# Patient Record
Sex: Male | Born: 1980 | Race: White | Hispanic: No | Marital: Married | Smoking: Never smoker
Health system: Southern US, Community
[De-identification: ages and names within clinical notes are randomized; demographics above are authoritative.]

## PROBLEM LIST (undated history)

## (undated) DIAGNOSIS — R569 Unspecified convulsions: Secondary | ICD-10-CM

## (undated) HISTORY — DX: Unspecified convulsions: R56.9

---

## 2020-10-13 ENCOUNTER — Encounter: Payer: Self-pay | Admitting: *Deleted

## 2020-10-15 ENCOUNTER — Encounter: Payer: Self-pay | Admitting: Diagnostic Neuroimaging

## 2020-10-15 ENCOUNTER — Ambulatory Visit (INDEPENDENT_AMBULATORY_CARE_PROVIDER_SITE_OTHER): Payer: 59 | Admitting: Diagnostic Neuroimaging

## 2020-10-15 ENCOUNTER — Other Ambulatory Visit: Payer: Self-pay

## 2020-10-15 VITALS — BP 135/92 | HR 82 | Ht 69.0 in | Wt 265.0 lb

## 2020-10-15 DIAGNOSIS — R569 Unspecified convulsions: Secondary | ICD-10-CM | POA: Diagnosis not present

## 2020-10-15 NOTE — Patient Instructions (Signed)
-   MRI brain, EEG  - According to [place] law, you can not drive unless you are seizure / syncope free for at least 6 months and under physician's care.   - Please maintain precautions. Do not participate in activities where a loss of awareness could harm you or someone else. No swimming alone, no tub bathing, no hot tubs, no driving, no operating motorized vehicles (cars, ATVs, motocycles, etc), lawnmowers, power tools or firearms. No standing at heights, such as rooftops, ladders or stairs. Avoid hot objects such as stoves, heaters, open fires. Wear a helmet when riding a bicycle, scooter, skateboard, etc. and avoid areas of traffic. Set your water heater to 120 degrees or less.

## 2020-10-15 NOTE — Progress Notes (Signed)
[facility]  PATIENT: Francis Flores DOB: 04-24-1981  REFERRING CLINICIAN: Koirala, Dibas, MD HISTORY FROM: patient and wife  REASON FOR VISIT: new consult    HISTORICAL  CHIEF COMPLAINT:  Chief Complaint  Patient presents with  . New Patient (Initial Visit)    rm 6- Pt here with his wife because he has a seizure in his sleep.    HISTORY OF PRESENT ILLNESS:   39 year old male here for evaluation of seizure.  10/08/2020 at 2 in the morning patient was asleep, and woke up wife because he could grab her arm.  She woke up and then found that he was unresponsive, with generalized tonic-clonic seizure.  His body was shaking but arms were not moving.  Seizures lasted for about 1 to 2 minutes.  Patient bit his tongue.  Afterwards he went to a deep sleep and started to snore.  She called 911 and paramedics arrived.  Vital signs showed blood pressure 146/94, pulse 118, blood glucose 94, temperature 97.1, EKG strip showed sinus tachycardia.  Within about 1 hour he started to wake up and was able to answer questions.  Patient has had some headaches and muscle soreness since the seizure.  They followed up with walk-in urgent care clinic the next day and had lab testing which were unremarkable.  Patient referred here for further evaluation.  In the few days leading up to this event patient had been having some stomach pain issues and decreased sleep.  No prior or recent accidents injuries or traumas.  No history of meningitis or encephalitis.  No family history of seizure.  No similar events in his past.  Patient does not drink caffeine or alcohol.    REVIEW OF SYSTEMS: Full 14 system review of systems performed and negative with exception of: As per HPI.  ALLERGIES: No Known Allergies  HOME MEDICATIONS: No outpatient medications prior to visit.   No facility-administered medications prior to visit.    PAST MEDICAL HISTORY: Past Medical History:  Diagnosis Date   . Seizure-like activity ([place])     PAST SURGICAL HISTORY: History reviewed. No pertinent surgical history.  FAMILY HISTORY: No family history on file.  SOCIAL HISTORY: Social History   Socioeconomic History  . Marital status: Married    Spouse name: Not on file  . Number of children: 0  . Years of education: Not on file  . Highest education level: Not on file  Occupational History    Comment: engineer  Tobacco Use  . Smoking status: Never Smoker  Substance and Sexual Activity  . Alcohol use: Not Currently  . Drug use: Not Currently  . Sexual activity: Not on file  Other Topics Concern  . Not on file  Social History Narrative  . Not on file   Social Determinants of Health   Financial Resource Strain:   . Difficulty of Paying Living Expenses: Not on file  Food Insecurity:   . Worried About Charity fundraiser in the Last Year: Not on file  . Ran Out of Food in the Last Year: Not on file  Transportation Needs:   . Lack of Transportation (Medical): Not on file  . Lack of Transportation (Non-Medical): Not on file  Physical Activity:   . Days of Exercise per Week: Not on file  . Minutes of Exercise per Session: Not on file  Stress:   . Feeling of Stress : Not on file  Social Connections:   . Frequency of Communication with Friends and  Family: Not on file  . Frequency of Social Gatherings with Friends and Family: Not on file  . Attends Religious Services: Not on file  . Active Member of Clubs or Organizations: Not on file  . Attends Archivist Meetings: Not on file  . Marital Status: Not on file  Intimate Partner Violence:   . Fear of Current or Ex-Partner: Not on file  . Emotionally Abused: Not on file  . Physically Abused: Not on file  . Sexually Abused: Not on file     PHYSICAL EXAM  GENERAL EXAM/CONSTITUTIONAL: Vitals:  Vitals:   10/15/20 0928 10/15/20 0930  BP: (!) 136/96 (!) 135/92  Pulse: 80 82  Weight: 265 lb (120.2 kg)   Height: 5\' 9"   (1.753 m)      Body mass index is 39.13 kg/m. Wt Readings from Last 3 Encounters:  10/15/20 265 lb (120.2 kg)     Patient is in no distress; well developed, nourished and groomed; neck is supple  CARDIOVASCULAR:  Examination of carotid arteries is normal; no carotid bruits  Regular rate and rhythm, no murmurs  Examination of peripheral vascular system by observation and palpation is normal  EYES:  Ophthalmoscopic exam of optic discs and posterior segments is normal; no papilledema or hemorrhages  No exam data present  MUSCULOSKELETAL:  Gait, strength, tone, movements noted in Neurologic exam below  NEUROLOGIC: MENTAL STATUS:  No flowsheet data found.  awake, alert, oriented to person, place and time  recent and remote memory intact  normal attention and concentration  language fluent, comprehension intact, naming intact  fund of knowledge appropriate  CRANIAL NERVE:   2nd - no papilledema on fundoscopic exam  2nd, 3rd, 4th, 6th - pupils equal and reactive to light, visual fields full to confrontation, extraocular muscles intact, no nystagmus  5th - facial sensation symmetric  7th - facial strength symmetric  8th - hearing intact  9th - palate elevates symmetrically, uvula midline  11th - shoulder shrug symmetric  12th - tongue protrusion midline  MOTOR:   normal bulk and tone, full strength in the BUE, BLE  SENSORY:   normal and symmetric to light touch, temperature, vibration  COORDINATION:   finger-nose-finger, fine finger movements normal  REFLEXES:   deep tendon reflexes present and symmetric  GAIT/STATION:   narrow based gait; romberg is negative    DIAGNOSTIC DATA (LABS, IMAGING, TESTING) - I reviewed patient records, labs, notes, testing and imaging myself where available.  No results found for: WBC, HGB, HCT, MCV, PLT No results found for: NA, K, CL, CO2, GLUCOSE, BUN, CREATININE, CALCIUM, PROT, ALBUMIN, AST, ALT,  ALKPHOS, BILITOT, GFRNONAA, GFRAA No results found for: CHOL, HDL, LDLCALC, LDLDIRECT, TRIG, CHOLHDL No results found for: HGBA1C No results found for: VITAMINB12 No results found for: TSH   10/08/20 CBC, CMP normal except slightly elevated LFTs    ASSESSMENT AND PLAN  39 y.o. year old male here with:  Dx:  1. New onset seizure ([place])      PLAN:   NEW ONSET SEIZURE (10/08/20)  - check MRI brain, EEG; hold off on anti-seizure meds for now, unless MRI or EEG show abnl  - According to [place] law, you can not drive unless you are seizure / syncope free for at least 6 months and under physician's care.   - Please maintain precautions. Do not participate in activities where a loss of awareness could harm you or someone else. No swimming alone, no tub bathing, no hot tubs,  no driving, no operating motorized vehicles (cars, ATVs, motocycles, etc), lawnmowers, power tools or firearms. No standing at heights, such as rooftops, ladders or stairs. Avoid hot objects such as stoves, heaters, open fires. Wear a helmet when riding a bicycle, scooter, skateboard, etc. and avoid areas of traffic. Set your water heater to 120 degrees or less.   Orders Placed This Encounter  Procedures  . MR BRAIN W WO CONTRAST  . EEG adult   Return in about 6 months (around 04/15/2021).    Penni Bombard, MD 43/70/0525, 91:02 AM Certified in Neurology, Neurophysiology and Neuroimaging  [facility] [address], [place], [place] [address] [phone]

## 2020-10-18 ENCOUNTER — Telehealth: Payer: Self-pay | Admitting: Diagnostic Neuroimaging

## 2020-10-18 NOTE — Telephone Encounter (Signed)
[employer] order sent to [facility]. They will obtain the auth and reach out to the patient to schedule.  

## 2020-10-29 ENCOUNTER — Other Ambulatory Visit: Payer: Self-pay

## 2020-10-29 ENCOUNTER — Ambulatory Visit
Admission: RE | Admit: 2020-10-29 | Discharge: 2020-10-29 | Disposition: A | Discharge status: Home / Self Care | Payer: 59 | Source: Ambulatory Visit | Attending: Diagnostic Neuroimaging | Admitting: Diagnostic Neuroimaging

## 2020-10-29 DIAGNOSIS — R569 Unspecified convulsions: Secondary | ICD-10-CM | POA: Diagnosis not present

## 2020-10-29 MED ORDER — GADOBENATE DIMEGLUMINE 529 MG/ML IV SOLN
20.0000 mL | Freq: Once | INTRAVENOUS | Status: AC | PRN
  Administered 2020-10-29: 20 mL via INTRAVENOUS

## 2020-10-31 ENCOUNTER — Telehealth: Payer: Self-pay | Admitting: Neurology

## 2020-11-01 ENCOUNTER — Telehealth: Payer: Self-pay | Admitting: *Deleted

## 2020-11-01 MED ORDER — LEVETIRACETAM 500 MG PO TABS: 500.0000 mg | ORAL_TABLET | Freq: Two times a day (BID) | ORAL | 12 refills | Status: DC

## 2020-11-01 NOTE — Telephone Encounter (Signed)
Spoke with patient and informed him the MRI brain result was an abnormal study with asmall focus of bleeding.  Dr Leta Baptist recommends to start levetiracetam 500mg  twice a day. He will order additional testing. I advised he wants to have a phone / video visit. Patient agreed to new Rx; I advised he activate My Chart which is pending. He agreed, and we scheduled my chart VV for Wed. Patient verbalized understanding, appreciation.

## 2020-11-01 NOTE — Telephone Encounter (Signed)
I called patient to confirm.  Reviewed MRI results.  We'll start levetiracetam 500 mg twice a day.  We'll follow up on video visit on Wednesday to discuss further testing.  We'll plan to check repeat MRI in 4 to 6 weeks.  Also may check CT chest abdomen and pelvis to rule out other etiologies.  Meds ordered this encounter  Medications  . levETIRAcetam (KEPPRA) 500 MG tablet    Sig: Take 1 tablet (500 mg total) by mouth 2 (two) times daily.    Dispense:  60 tablet    Refill:  [place], MD 13/24/4010, 2:72 PM Certified in Neurology, Neurophysiology and Neuroimaging  [facility] [address], [place], [place] [address] [phone]

## 2020-11-03 ENCOUNTER — Encounter: Payer: Self-pay | Admitting: Diagnostic Neuroimaging

## 2020-11-03 ENCOUNTER — Telehealth (INDEPENDENT_AMBULATORY_CARE_PROVIDER_SITE_OTHER): Payer: 59 | Admitting: Diagnostic Neuroimaging

## 2020-11-03 DIAGNOSIS — I611 Nontraumatic intracerebral hemorrhage in hemisphere, cortical: Secondary | ICD-10-CM | POA: Diagnosis not present

## 2020-11-03 DIAGNOSIS — R569 Unspecified convulsions: Secondary | ICD-10-CM

## 2020-11-03 NOTE — Progress Notes (Signed)
[facility]  PATIENT: Francis Flores DOB: 08-17-1981  REFERRING CLINICIAN: Koirala, Dibas, MD HISTORY FROM: patient  REASON FOR VISIT: follow up   HISTORICAL  CHIEF COMPLAINT:  Chief Complaint  Patient presents with  . Seizures    HISTORY OF PRESENT ILLNESS:   UPDATE (11/03/20, VRP): Since last visit, doing well.  MRI brain results reviewed which show left temporal ICH, well-circumscribed multilevel loculated lesion with mild peripheral edema.  Underlying mass or vascular malformation cannot be ruled out.  Now on levetiracetam 5 mg twice a day.  Tolerating medication.  No further seizures.   PRIOR HPI (10/15/20): 39 year old male here for evaluation of seizure.  10/08/2020 at 2 in the morning patient was asleep, and woke up wife because he could grab her arm.  She woke up and then found that he was unresponsive, with generalized tonic-clonic seizure.  His body was shaking but arms were not moving.  Seizures lasted for about 1 to 2 minutes.  Patient bit his tongue.  Afterwards he went to a deep sleep and started to snore.  She called 911 and paramedics arrived.  Vital signs showed blood pressure 146/94, pulse 118, blood glucose 94, temperature 97.1, EKG strip showed sinus tachycardia.  Within about 1 hour he started to wake up and was able to answer questions.  Patient has had some headaches and muscle soreness since the seizure.  They followed up with walk-in urgent care clinic the next day and had lab testing which were unremarkable.  Patient referred here for further evaluation.  In the few days leading up to this event patient had been having some stomach pain issues and decreased sleep.  No prior or recent accidents injuries or traumas.  No history of meningitis or encephalitis.  No family history of seizure.  No similar events in his past.  Patient does not drink caffeine or alcohol.    REVIEW OF SYSTEMS: Full 14 system review of systems performed and  negative with exception of: As per HPI.  ALLERGIES: No Known Allergies  HOME MEDICATIONS: Outpatient Medications Prior to Visit  Medication Sig Dispense Refill  . levETIRAcetam (KEPPRA) 500 MG tablet Take 1 tablet (500 mg total) by mouth 2 (two) times daily. 60 tablet 12   No facility-administered medications prior to visit.    PAST MEDICAL HISTORY: Past Medical History:  Diagnosis Date  . Seizure-like activity ([place])     PAST SURGICAL HISTORY: No past surgical history on file.  FAMILY HISTORY: No family history on file.  SOCIAL HISTORY: Social History   Socioeconomic History  . Marital status: Married    Spouse name: Not on file  . Number of children: 0  . Years of education: Not on file  . Highest education level: Not on file  Occupational History    Comment: engineer  Tobacco Use  . Smoking status: Never Smoker  . Smokeless tobacco: Never Used  Substance and Sexual Activity  . Alcohol use: Not Currently  . Drug use: Not Currently  . Sexual activity: Not on file  Other Topics Concern  . Not on file  Social History Narrative  . Not on file   Social Determinants of Health   Financial Resource Strain:   . Difficulty of Paying Living Expenses: Not on file  Food Insecurity:   . Worried About Charity fundraiser in the Last Year: Not on file  . Ran Out of Food in the Last Year: Not on file  Transportation Needs:   .  Lack of Transportation (Medical): Not on file  . Lack of Transportation (Non-Medical): Not on file  Physical Activity:   . Days of Exercise per Week: Not on file  . Minutes of Exercise per Session: Not on file  Stress:   . Feeling of Stress : Not on file  Social Connections:   . Frequency of Communication with Friends and Family: Not on file  . Frequency of Social Gatherings with Friends and Family: Not on file  . Attends Religious Services: Not on file  . Active Member of Clubs or Organizations: Not on file  . Attends Archivist  Meetings: Not on file  . Marital Status: Not on file  Intimate Partner Violence:   . Fear of Current or Ex-Partner: Not on file  . Emotionally Abused: Not on file  . Physically Abused: Not on file  . Sexually Abused: Not on file     PHYSICAL EXAM  Video visit    DIAGNOSTIC DATA (LABS, IMAGING, TESTING) - I reviewed patient records, labs, notes, testing and imaging myself where available.  No results found for: WBC, HGB, HCT, MCV, PLT No results found for: NA, K, CL, CO2, GLUCOSE, BUN, CREATININE, CALCIUM, PROT, ALBUMIN, AST, ALT, ALKPHOS, BILITOT, GFRNONAA, GFRAA No results found for: CHOL, HDL, LDLCALC, LDLDIRECT, TRIG, CHOLHDL No results found for: HGBA1C No results found for: VITAMINB12 No results found for: TSH   10/08/20 CBC, CMP normal except slightly elevated LFTs  10/29/20 MRI of the brain with and without contrast [I reviewed images myself and agree with interpretation. -VRP]  1.  There is a hemorrhage in the lateral left temporal lobe with intracellular and extracellular methemoglobin consistent with an event occurring between 3 and 28 days ago, most likely in the middle of the range.  There is mild mass-effect associated with this focus.  Although there is no enhancement noted, a pathologic etiology cannot be ruled out.  Consider repeat imaging in the future to further evaluate. 2.  The rest of the brain was normal.     ASSESSMENT AND PLAN  39 y.o. year old male here with:  Dx:  1. New onset seizure ([place])   2. 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   - Please maintain precautions. Do not participate in activities where a loss of awareness could harm you or someone else. No swimming alone, no tub bathing, no hot tubs, no driving, no operating motorized vehicles (cars, ATVs, motocycles, etc), lawnmowers, power tools or firearms. No standing at heights, such as rooftops, ladders or stairs. Avoid hot objects such as stoves, heaters, open fires. Wear a helmet when riding a bicycle, scooter, skateboard, etc. and avoid areas of traffic. Set your water heater to 120 degrees or less.   Orders Placed This Encounter  Procedures  . CT ANGIO HEAD W OR WO CONTRAST   Return in about 3 months (around 02/03/2021).    Virtual Visit via Video Note  I connected with Francis Pigeon  Flores on 11/03/20 at  3:00 PM EST by a video enabled telemedicine application and verified that I am speaking with the correct person using two identifiers.  Location: Patient: home Provider: office   I discussed the limitations of evaluation and management by telemedicine and the availability of in person appointments. The patient expressed understanding and agreed to proceed.  I discussed the assessment and treatment plan with the patient. The patient was provided an opportunity to ask questions and all were answered. The patient agreed with the plan and demonstrated an understanding of the instructions.   The patient was advised to call back or seek an in-person evaluation if the symptoms worsen or if the condition fails to improve as anticipated.  I spent 20 minutes of non-face-to-face time with patient.  This included previsit chart review, lab review, study review, order entry, electronic health record documentation, patient education.     Penni Bombard, MD 65/79/0383, 3:38 PM Certified in Neurology, Neurophysiology and Neuroimaging  [facility] [address], [place], [place] [address] [phone]

## 2020-11-04 ENCOUNTER — Telehealth: Payer: Self-pay | Admitting: Diagnostic Neuroimaging

## 2020-11-04 NOTE — Telephone Encounter (Signed)
[employer] order sent to [facility]. They will obtain the auth and reach out to the patient to schedule.  

## 2020-11-05 ENCOUNTER — Other Ambulatory Visit: Payer: Self-pay | Admitting: Diagnostic Neuroimaging

## 2020-11-08 NOTE — Telephone Encounter (Signed)
Note created in error.

## 2020-11-18 ENCOUNTER — Encounter: Payer: Self-pay | Admitting: *Deleted

## 2020-11-18 ENCOUNTER — Telehealth: Payer: Self-pay | Admitting: *Deleted

## 2020-11-18 NOTE — Telephone Encounter (Signed)
I don't think keppra causing headaches. I would recommend to continue. I am more concerned about the bleeding from prior, and possible expansion. If headaches are severe, then I would recommend ER evaluation today, and they can get scan done in hospital. -VRP

## 2020-11-18 NOTE — Telephone Encounter (Signed)
Can we get his CTA head scan setup for today or tomorrow? -VRP

## 2020-11-18 NOTE — Telephone Encounter (Signed)
Called [facility], spoke with Crystal to move CT angio head scan sooner. She stated no availability sooner  than Mon,  8:30 am. She will hold apt until I can confirm with patient. Called patient who stated he can do CT angio head on 12/6, 8:30 am. Called Crystal at [phone], appointment rescheduled. I notified patient via my chart with instructions and address, asked for reply so I know he received it.

## 2020-11-18 NOTE — Telephone Encounter (Signed)
Received my chart stating he is having pretty consistent  headaches since starting keppra 2 weeks ago, asking if due to medication.  No relief with adequate hydration and OTC meds.

## 2020-11-18 NOTE — Telephone Encounter (Signed)
Spoke with patient and advised him of all Dr Gladstone Lighter recommendations. I advised he monitor headaches closely and if pattern of escalating in any form he needs to go to ER asap. He had no questions, agreed, verbalized understanding, appreciation.

## 2020-11-18 NOTE — Telephone Encounter (Signed)
I called GI and they are setting up CTA head for tomorrow at 9am. -VRP

## 2020-11-19 ENCOUNTER — Other Ambulatory Visit: Payer: Self-pay

## 2020-11-19 ENCOUNTER — Telehealth: Payer: Self-pay | Admitting: Diagnostic Neuroimaging

## 2020-11-19 ENCOUNTER — Ambulatory Visit
Admission: RE | Admit: 2020-11-19 | Discharge: 2020-11-19 | Disposition: A | Discharge status: Home / Self Care | Payer: 59 | Source: Ambulatory Visit | Attending: Diagnostic Neuroimaging | Admitting: Diagnostic Neuroimaging

## 2020-11-19 ENCOUNTER — Other Ambulatory Visit: Payer: Self-pay | Admitting: Radiation Therapy

## 2020-11-19 DIAGNOSIS — R569 Unspecified convulsions: Secondary | ICD-10-CM

## 2020-11-19 DIAGNOSIS — I611 Nontraumatic intracerebral hemorrhage in hemisphere, cortical: Secondary | ICD-10-CM

## 2020-11-19 DIAGNOSIS — D496 Neoplasm of unspecified behavior of brain: Secondary | ICD-10-CM

## 2020-11-19 MED ORDER — IOPAMIDOL (ISOVUE-370) INJECTION 76%
75.0000 mL | Freq: Once | INTRAVENOUS | Status: AC | PRN
  Administered 2020-11-19: 75 mL via INTRAVENOUS

## 2020-11-19 MED ORDER — DEXAMETHASONE 4 MG PO TABS: 4.0000 mg | ORAL_TABLET | Freq: Four times a day (QID) | ORAL | 1 refills | Status: DC

## 2020-11-19 NOTE — Telephone Encounter (Signed)
I reviewed CTA imaging, called patient and discussed with Neuro-oncology.  CTA head shows expanding left temporal lobe lesion with increased vasogenic edema and mild left to right midline shift.  Patient is doing well clinically.  His headaches are stable.  I am calling in dexamethasone 4 mg 4 times a day to help improve his headaches and edema.  I placed urgent consult to neuro oncology.  Discussed case with Dr. Mickeal Skinner by phone, who kindly agreed to present his case at tumor board on Monday.  They will help coordinate further evaluation and treatment.  Meds ordered this encounter  Medications  . dexamethasone (DECADRON) 4 MG tablet    Sig: Take 1 tablet (4 mg total) by mouth 4 (four) times daily.    Dispense:  60 tablet    Refill:  1   Orders Placed This Encounter  Procedures  . Amb Referral to Neuro Oncology    Penni Bombard, MD 79/12/5054, 97:94 AM Certified in Neurology, Neurophysiology and Neuroimaging  [facility] [address] [place], [place] [address] [phone]

## 2020-11-22 ENCOUNTER — Other Ambulatory Visit: Payer: 59

## 2020-11-22 ENCOUNTER — Telehealth: Payer: Self-pay | Admitting: Internal Medicine

## 2020-11-22 ENCOUNTER — Inpatient Hospital Stay: Payer: 59 | Attending: Internal Medicine

## 2020-11-22 DIAGNOSIS — G40909 Epilepsy, unspecified, not intractable, without status epilepticus: Secondary | ICD-10-CM | POA: Insufficient documentation

## 2020-11-22 DIAGNOSIS — Z79899 Other long term (current) drug therapy: Secondary | ICD-10-CM | POA: Insufficient documentation

## 2020-11-22 DIAGNOSIS — R609 Edema, unspecified: Secondary | ICD-10-CM | POA: Insufficient documentation

## 2020-11-22 DIAGNOSIS — S0083XA Contusion of other part of head, initial encounter: Secondary | ICD-10-CM | POA: Insufficient documentation

## 2020-11-22 DIAGNOSIS — I700 Atherosclerosis of aorta: Secondary | ICD-10-CM | POA: Insufficient documentation

## 2020-11-22 DIAGNOSIS — Z7952 Long term (current) use of systemic steroids: Secondary | ICD-10-CM | POA: Insufficient documentation

## 2020-11-22 DIAGNOSIS — G9389 Other specified disorders of brain: Secondary | ICD-10-CM | POA: Insufficient documentation

## 2020-11-22 NOTE — Telephone Encounter (Signed)
Received a referral from [facility] for brain tumor. Mr. Francis Flores has been scheduled to see Dr. Mickeal Skinner on 12/16 at 9am. I lft the appt date and time on the pt's vm.

## 2020-11-24 ENCOUNTER — Other Ambulatory Visit (HOSPITAL_COMMUNITY): Payer: Self-pay | Admitting: Neurological Surgery

## 2020-11-24 DIAGNOSIS — D496 Neoplasm of unspecified behavior of brain: Secondary | ICD-10-CM

## 2020-11-25 ENCOUNTER — Other Ambulatory Visit: Payer: Self-pay | Admitting: Neurological Surgery

## 2020-11-26 ENCOUNTER — Other Ambulatory Visit: Payer: 59

## 2020-11-29 ENCOUNTER — Encounter (HOSPITAL_COMMUNITY): Payer: Self-pay

## 2020-11-29 ENCOUNTER — Other Ambulatory Visit: Payer: Self-pay

## 2020-11-29 ENCOUNTER — Ambulatory Visit (HOSPITAL_COMMUNITY)
Admission: RE | Admit: 2020-11-29 | Discharge: 2020-11-29 | Disposition: A | Discharge status: Home / Self Care | Payer: 59 | Source: Ambulatory Visit | Attending: Neurological Surgery | Admitting: Neurological Surgery

## 2020-11-29 DIAGNOSIS — D496 Neoplasm of unspecified behavior of brain: Secondary | ICD-10-CM | POA: Diagnosis present

## 2020-11-29 MED ORDER — IOHEXOL 300 MG/ML  SOLN
100.0000 mL | Freq: Once | INTRAMUSCULAR | Status: AC | PRN
  Administered 2020-11-29: 08:00:00 100 mL via INTRAVENOUS

## 2020-11-29 MED ORDER — SODIUM CHLORIDE (PF) 0.9 % IJ SOLN
INTRAMUSCULAR | Status: AC
  Filled 2020-11-29: qty 50

## 2020-11-30 NOTE — Pre-Procedure Instructions (Signed)
Your procedure is scheduled on Tues., Dec. 21, 2021 from 7:30AM-11:40AM  Report to [facility] Entrance "A" at 5:30AM  Call this number if you have problems the morning of surgery:  [phone]   Remember:  Do not eat or drink after midnight on Dec. 20th    Take these medicines the morning of surgery with A SIP OF WATER: Dexamethasone (DECADRON) LevETIRAcetam (KEPPRA)  As of today, STOP taking all Aspirin (unless instructed by your doctor) and Other Aspirin containing products, Vitamins, Fish oils, and Herbal medications. Also stop all NSAIDS i.e. Advil, Ibuprofen, Motrin, Aleve, Anaprox, Naproxen, BC, Goody Powders, and all Supplements.    No Smoking of any kind, Tobacco/Vaping, or Alcohol products 24 hours prior to your procedure. If you use a Cpap at night, you may bring all equipment for your overnight stay.   Special instructions:  [place]- Preparing For Surgery  Before surgery, you can play an important role. Because skin is not sterile, your skin needs to be as free of germs as possible. You can reduce the number of germs on your skin by washing with CHG (chlorahexidine gluconate) Soap before surgery.  CHG is an antiseptic cleaner which kills germs and bonds with the skin to continue killing germs even after washing.    Please do not use if you have an allergy to CHG or antibacterial soaps. If your skin becomes reddened/irritated stop using the CHG.  Do not shave (including legs and underarms) for at least 48 hours prior to first CHG shower. It is OK to shave your face.  Please follow these instructions carefully.   1. Shower the NIGHT BEFORE SURGERY and the MORNING OF SURGERY with CHG.   2. If you chose to wash your hair, wash your hair first as usual with your normal shampoo.  3. After you shampoo, rinse your hair and body thoroughly to remove the shampoo.  4. Use CHG as you would any other liquid soap. You can apply CHG directly to the skin and wash gently  with a scrungie or a clean washcloth.   5. Apply the CHG Soap to your body ONLY FROM THE NECK DOWN.  Do not use on open wounds or open sores. Avoid contact with your eyes, ears, mouth and genitals (private parts). Wash Face and genitals (private parts)  with your normal soap.  6. Wash thoroughly, paying special attention to the area where your surgery will be performed.  7. Thoroughly rinse your body with warm water from the neck down.  8. DO NOT shower/wash with your normal soap after using and rinsing off the CHG Soap.  9. Pat yourself dry with a CLEAN TOWEL.  10. Wear CLEAN PAJAMAS to bed the night before surgery, wear comfortable clothes the morning of surgery  11. Place CLEAN SHEETS on your bed the night of your first shower and DO NOT SLEEP WITH PETS.   Day of Surgery:             Remember to brush your teeth WITH YOUR REGULAR TOOTHPASTE.  Do not wear jewelry.  Do not wear lotions, powders, colognes, or deodorant.  Do not shave 48 hours prior to surgery.  Men may shave face and neck.  Do not bring valuables to the hospital.  [facility] is not responsible for any belongings or valuables.  Contacts, dentures or bridgework may not be worn into surgery.   For patients admitted to the hospital, discharge time will be determined by your treatment team.  Patients  discharged the day of surgery will not be allowed to drive home, and someone age 24 and over needs to stay with them for 24 hours.  Please wear clean clothes to the hospital/surgery center.    Please read over the following fact sheets that you were given.

## 2020-12-01 ENCOUNTER — Encounter (HOSPITAL_COMMUNITY): Payer: Self-pay

## 2020-12-01 ENCOUNTER — Encounter (HOSPITAL_COMMUNITY)
Admission: RE | Admit: 2020-12-01 | Discharge: 2020-12-01 | Disposition: A | Discharge status: Home / Self Care | Payer: 59 | Source: Ambulatory Visit | Attending: Neurological Surgery | Admitting: Neurological Surgery

## 2020-12-01 ENCOUNTER — Other Ambulatory Visit: Payer: Self-pay

## 2020-12-01 DIAGNOSIS — Z01812 Encounter for preprocedural laboratory examination: Secondary | ICD-10-CM | POA: Insufficient documentation

## 2020-12-01 LAB — TYPE AND SCREEN
ABO/RH(D): O POS
Antibody Screen: NEGATIVE

## 2020-12-01 LAB — SURGICAL PCR SCREEN
MRSA, PCR: NEGATIVE
Staphylococcus aureus: NEGATIVE

## 2020-12-01 LAB — CBC
HCT: 51.5 % (ref 39.0–52.0)
Hemoglobin: 16.8 g/dL (ref 13.0–17.0)
MCH: 29.9 pg (ref 26.0–34.0)
MCHC: 32.6 g/dL (ref 30.0–36.0)
MCV: 91.6 fL (ref 80.0–100.0)
Platelets: 289 10*3/uL (ref 150–400)
RBC: 5.62 MIL/uL (ref 4.22–5.81)
RDW: 12.9 % (ref 11.5–15.5)
WBC: 20.3 10*3/uL — ABNORMAL HIGH (ref 4.0–10.5)
nRBC: 0 % (ref 0.0–0.2)

## 2020-12-01 LAB — BASIC METABOLIC PANEL
Anion gap: 11 (ref 5–15)
BUN: 26 mg/dL — ABNORMAL HIGH (ref 6–20)
CO2: 25 mmol/L (ref 22–32)
Calcium: 9 mg/dL (ref 8.9–10.3)
Chloride: 97 mmol/L — ABNORMAL LOW (ref 98–111)
Creatinine, Ser: 0.86 mg/dL (ref 0.61–1.24)
GFR, Estimated: >60 mL/min (ref >60)
Glucose, Bld: 99 mg/dL (ref 70–99)
Potassium: 4.4 mmol/L (ref 3.5–5.1)
Sodium: 133 mmol/L — ABNORMAL LOW (ref 135–145)

## 2020-12-01 NOTE — Progress Notes (Signed)
PCP:  Denies Cardiologist:  Denies  EKG:  N/A CXR:  N/A ECHO:  Denies Stress Test:  Denies Cardiac Cath: Denies  Fasting Blood Sugar-  N/A Checks Blood Sugar__N/A_ times a day  OSA/CPAP:  No  ASA/Blood Thinner:  No  Covid test 12/04/20  Anesthesia Review:  No  Patient denies shortness of breath, fever, cough, and chest pain at PAT appointment.  Patient verbalized understanding of instructions provided today at the PAT appointment.  Patient asked to review instructions at home and day of surgery.

## 2020-12-02 ENCOUNTER — Encounter: Payer: Self-pay | Admitting: Internal Medicine

## 2020-12-02 ENCOUNTER — Inpatient Hospital Stay (HOSPITAL_BASED_OUTPATIENT_CLINIC_OR_DEPARTMENT_OTHER): Payer: 59 | Admitting: Internal Medicine

## 2020-12-02 ENCOUNTER — Other Ambulatory Visit: Payer: Self-pay

## 2020-12-02 DIAGNOSIS — S0083XA Contusion of other part of head, initial encounter: Secondary | ICD-10-CM | POA: Insufficient documentation

## 2020-12-02 DIAGNOSIS — Z79899 Other long term (current) drug therapy: Secondary | ICD-10-CM | POA: Insufficient documentation

## 2020-12-02 DIAGNOSIS — G40909 Epilepsy, unspecified, not intractable, without status epilepticus: Secondary | ICD-10-CM | POA: Diagnosis present

## 2020-12-02 DIAGNOSIS — R609 Edema, unspecified: Secondary | ICD-10-CM

## 2020-12-02 DIAGNOSIS — G9389 Other specified disorders of brain: Secondary | ICD-10-CM | POA: Diagnosis present

## 2020-12-02 DIAGNOSIS — R569 Unspecified convulsions: Secondary | ICD-10-CM

## 2020-12-02 DIAGNOSIS — Z7952 Long term (current) use of systemic steroids: Secondary | ICD-10-CM

## 2020-12-02 DIAGNOSIS — I700 Atherosclerosis of aorta: Secondary | ICD-10-CM | POA: Diagnosis not present

## 2020-12-02 NOTE — Progress Notes (Signed)
[place] [facility] [place], [place] [address] [phone]   New Patient Evaluation  Date of Service: 12/02/20 Patient Name: Francis Flores Patient MRN: [id number] Patient DOB: Apr 08, 1981 Provider: Ventura Sellers, MD  Identifying Statement:  Francis Flores is a 39 y.o. male with left temporal mass who presents for initial consultation and evaluation.    Referring Provider: Lujean Amel, MD [address],  [place] [address]  Oncologic History: Oncology History   No history exists.    Biomarkers:  MGMT Unknown.  IDH 1/2 Unknown.  EGFR Unknown  TERT Unknown   History of Present Illness: The patient's records from the referring physician were obtained and reviewed and the patient interviewed to confirm this HPI.  Francis Flores presented to medical attention on 10/08/20 with first ever seizure.  Event occurred at night, witnessed by his wife, described as generalized shaking.  There was period of confusion following.  CNS imaging performed by neurologist demonstrated hemorrhagic lesion within left temporal lobe of unclear etiology.  CT angio was then performed several weeks later, following onset of daily headaches, which demonstrated increased volume of blood products, swelling and possible underlying mass.  He was started on decadron 30m 4x per day, which led to improvement in headache symptomatology.  Currently, plans for resection of suspected mass next week with Dr. OZada Finders  Medications: Current Outpatient Medications on File Prior to Visit  Medication Sig Dispense Refill  . dexamethasone (DECADRON) 4 MG tablet Take 1 tablet (4 mg total) by mouth 4 (four) times daily. 60 tablet 1  . ibuprofen (ADVIL) 200 MG tablet Take 400 mg by mouth every 6 (six) hours as needed for moderate pain or headache.    . levETIRAcetam (KEPPRA) 500 MG tablet Take 1 tablet (500 mg total) by mouth 2 (two) times daily. 60  tablet 12   No current facility-administered medications on file prior to visit.    Allergies: No Known Allergies Past Medical History:  Past Medical History:  Diagnosis Date  . Seizure-like activity ([facility]    Past Surgical History:  Past Surgical History:  Procedure Laterality Date  . arm fracture     Social History:  Social History   Socioeconomic History  . Marital status: Married    Spouse name: Not on file  . Number of children: 0  . Years of education: Not on file  . Highest education level: Not on file  Occupational History    Comment: engineer  Tobacco Use  . Smoking status: Never Smoker  . Smokeless tobacco: Never Used  Vaping Use  . Vaping Use: Never used  Substance and Sexual Activity  . Alcohol use: Never  . Drug use: Not Currently  . Sexual activity: Not on file  Other Topics Concern  . Not on file  Social History Narrative  . Not on file   Social Determinants of Health   Financial Resource Strain: Not on file  Food Insecurity: Not on file  Transportation Needs: Not on file  Physical Activity: Not on file  Stress: Not on file  Social Connections: Not on file  Intimate Partner Violence: Not on file   Family History: History reviewed. No pertinent family history.  Review of Systems: Constitutional: Doesn't report fevers, chills or abnormal weight loss Eyes: Doesn't report blurriness of vision Ears, nose, mouth, throat, and face: Doesn't report sore throat Respiratory: Doesn't report cough, dyspnea or wheezes Cardiovascular: Doesn't report palpitation,  chest discomfort  Gastrointestinal:  Doesn't report nausea, constipation, diarrhea GU: Doesn't report incontinence Skin: Doesn't report skin rashes Neurological: Per HPI Musculoskeletal: Doesn't report joint pain Behavioral/Psych: Doesn't report anxiety  Physical Exam: Vitals:   12/02/20 0912  BP: (!) 133/99  Pulse: 80  Resp: 18  Temp: 97.7 F (36.5 C)  SpO2: 99%   KPS: 90. General:  Alert, cooperative, pleasant, in no acute distress Head: Normal EENT: No conjunctival injection or scleral icterus.  Lungs: Resp effort normal Cardiac: Regular rate Abdomen: Non-distended abdomen Skin: No rashes cyanosis or petechiae. Extremities: No clubbing or edema  Neurologic Exam: Mental Status: Awake, alert, attentive to examiner. Oriented to self and environment. Language is fluent with intact comprehension.  Cranial Nerves: Visual acuity is grossly normal. Visual fields are full. Extra-ocular movements intact. No ptosis. Face is symmetric Motor: Tone and bulk are normal. Power is full in both arms and legs. Reflexes are symmetric, no pathologic reflexes present.  Sensory: Intact to light touch Gait: Normal.   Labs: I have reviewed the data as listed    Component Value Date/Time   NA 133 (L) 12/01/2020 0833   K 4.4 12/01/2020 0833   CL 97 (L) 12/01/2020 0833   CO2 25 12/01/2020 0833   GLUCOSE 99 12/01/2020 0833   BUN 26 (H) 12/01/2020 0833   CREATININE 0.86 12/01/2020 0833   CALCIUM 9.0 12/01/2020 0833   GFRNONAA >60 12/01/2020 0833   Lab Results  Component Value Date   WBC 20.3 (H) 12/01/2020   HGB 16.8 12/01/2020   HCT 51.5 12/01/2020   MCV 91.6 12/01/2020   PLT 289 12/01/2020    Imaging:  CT ANGIO HEAD W OR WO CONTRAST  Result Date: 11/19/2020 CLINICAL DATA:  Left temporal ICH; evaluate for vascular malformation. EXAM: CT ANGIOGRAPHY HEAD TECHNIQUE: Multidetector CT imaging of the head was performed using the standard protocol during bolus administration of intravenous contrast. Multiplanar CT image reconstructions and MIPs were obtained to evaluate the vascular anatomy. CONTRAST:  103m ISOVUE-370 IOPAMIDOL (ISOVUE-370) INJECTION 76% COMPARISON:  MRI of the brain October 29, 2020. FINDINGS: CT HEAD Brain: Increase in size of subacute left temporal lobe hematoma now measuring 3.8 x 3.0 cm (compared to 2.4 x 2.1 cm on prior) with progression of the surrounding  vasogenic edema. There is mild mass effect on the left lateral ventricle, effacement of the surrounding cerebral sulci and minimal medialization of the uncus. No hydrocephalus or midline shift. Vascular: No hyperdense vessel or unexpected calcification. Skull: Normal. Negative for fracture or focal lesion. Sinuses: Mucous retention cyst of the right maxillary sinus. Orbits: No acute finding. CTA HEAD Anterior circulation: No significant stenosis, proximal occlusion, aneurysm, or vascular malformation. Displacement of the distal left MCA branches by hematoma mass effect. Posterior circulation: No significant stenosis, proximal occlusion, aneurysm, or vascular malformation. Venous sinuses: As permitted by contrast timing, patent. Anatomic variants: Hypoplastic bilateral P1/PCA with prominent bilateral posterior communicating arteries. IMPRESSION: 1. Increase in size of subacute left temporal lobe hematoma now measuring 3.8 x 3.0 cm (compared to 2.4 x 2.1 cm on prior) with progression of the surrounding vasogenic edema. There is mild mass effect on the left lateral ventricle, effacement of the surrounding cerebral sulci and minimal medialization of the uncus. No hydrocephalus or midline shift. 2. No vascular abnormality or significant stenosis of the intracranial vasculature. No aneurysm or vascular malformation. However, the possibility of a micro AVM cannot be excluded. Consider diagnostic cerebral angiogram after hematoma resolution. These results will be called to  the ordering clinician or representative by the Radiologist Assistant, and communication documented in the PACS or [employer]. Electronically Signed   By: Pedro Earls M.D.   On: 11/19/2020 14:37   CT CHEST ABDOMEN PELVIS W CONTRAST  Result Date: 11/29/2020 CLINICAL DATA:  Left temporal hematoma. Evaluate for metastatic disease. EXAM: CT CHEST, ABDOMEN, AND PELVIS WITH CONTRAST TECHNIQUE: Multidetector CT imaging of the chest,  abdomen and pelvis was performed following the standard protocol during bolus administration of intravenous contrast. CONTRAST:  1766m OMNIPAQUE IOHEXOL 300 MG/ML  SOLN COMPARISON:  None. FINDINGS: CT CHEST FINDINGS Cardiovascular: The heart size is normal. No substantial pericardial effusion. Atherosclerotic calcification is noted in the wall of the thoracic aorta. Mediastinum/Nodes: No mediastinal lymphadenopathy. There is no hilar lymphadenopathy. The esophagus has normal imaging features. There is no axillary lymphadenopathy. Lungs/Pleura: No suspicious pulmonary nodule or mass. No focal airspace consolidation. No pleural effusion. Musculoskeletal: No worrisome lytic or sclerotic osseous abnormality. CT ABDOMEN PELVIS FINDINGS Hepatobiliary: No suspicious focal abnormality within the liver parenchyma. Tiny calcified gallstone evident. No intrahepatic or extrahepatic biliary dilation. Pancreas: No focal mass lesion. No dilatation of the main duct. No intraparenchymal cyst. No peripancreatic edema. Spleen: No splenomegaly. No focal mass lesion. Adrenals/Urinary Tract: No adrenal nodule or mass. Left kidney unremarkable. 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 Stomach/Bowel: Stomach is unremarkable. No gastric wall thickening. No evidence of outlet obstruction. Duodenum is normally positioned as is the ligament of Treitz. No small bowel wall thickening. No small bowel dilatation. The terminal ileum is normal. The appendix is normal. No gross colonic mass. Mild colonic wall thickening in the rectum likely related to underdistention. Vascular/Lymphatic: No abdominal aortic aneurysm. No abdominal lymphadenopathy. No pelvic sidewall lymphadenopathy. Reproductive: The prostate gland and seminal vesicles are unremarkable. Other: No intraperitoneal free fluid. Musculoskeletal: No worrisome lytic or  sclerotic osseous abnormality. IMPRESSION: 1. No evidence for primary neoplasm or metastatic disease in the chest, abdomen, or pelvis. No acute findings on today's exam. 2.  Aortic Atherosclerois (ICD10-170.0) Electronically Signed   By: EMisty StanleyM.D.   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  Etiology is secondary to hemorrhagic lesion within left temporal region and accompanying edema.  Lesion is either neoplastic or vascular in nature.  Ct chest/abdomen/pelvis were negative for malignancy, and he has no systemic symptoms which arise suspicion for symptoms (skin lesions, B-symptoms). Regardless, volume of blood products and edema is increasing over less than 1 month, and resection is therefore recommended for treatment and diagnosis.  Case was discussed in brain/spine tumor board meeting, Dr. OZada Finderswill be surgeon of record for craniotomy on 12/21.   We discussed that our plans moving forward will depend mainly on histology, path report.  They understand this could possibly take several weeks.  Provided epilepsy safety education.  Recommend continuing Keppra 10038mBID.    Will stay on decadron 66m57mx per day just until surgery, at which time he will begin weaning.  We appreciate the opportunity to participate in the care of ThoJanalyn Shy Screening for potential clinical trials was performed and discussed using eligibility criteria for [facility], as well as national database available on [email or web address]  The patient is not a candidate for a research protocol at this time due to no suitable study identified.   We spent twenty additional minutes teaching regarding the natural history, biology, and historical experience in the treatment of brain tumors. We  then discussed in detail the current recommendations for therapy focusing on the mode of administration, mechanism of action, anticipated toxicities, and quality of life issues associated with this plan. We also provided teaching sheets for the patient to take home as an additional resource.  All questions were answered. The patient knows to call the clinic with any problems, questions or concerns. No barriers to learning were detected.  The total time spent in the encounter was 60 minutes and more than 50% was on counseling and review of test results   Ventura Sellers, MD Medical Director of Neuro-Oncology [facility] at [place] 12/02/20 3:56 PM

## 2020-12-04 ENCOUNTER — Other Ambulatory Visit (HOSPITAL_COMMUNITY)
Admission: RE | Admit: 2020-12-04 | Discharge: 2020-12-04 | Disposition: A | Discharge status: Home / Self Care | Payer: 59 | Source: Ambulatory Visit | Attending: Neurological Surgery | Admitting: Neurological Surgery

## 2020-12-04 DIAGNOSIS — Z01812 Encounter for preprocedural laboratory examination: Secondary | ICD-10-CM | POA: Insufficient documentation

## 2020-12-04 DIAGNOSIS — Z20822 Contact with and (suspected) exposure to covid-19: Secondary | ICD-10-CM | POA: Insufficient documentation

## 2020-12-04 LAB — SARS CORONAVIRUS 2 (TAT 6-24 HRS): SARS Coronavirus 2: NEGATIVE

## 2020-12-07 ENCOUNTER — Other Ambulatory Visit: Payer: Self-pay

## 2020-12-07 ENCOUNTER — Encounter (HOSPITAL_COMMUNITY)
Admission: RE | Disposition: A | Discharge status: Home / Self Care | Payer: Self-pay | Source: Home / Self Care | Attending: Neurological Surgery

## 2020-12-07 ENCOUNTER — Inpatient Hospital Stay (HOSPITAL_COMMUNITY): Payer: 59

## 2020-12-07 ENCOUNTER — Encounter (HOSPITAL_COMMUNITY): Payer: Self-pay | Admitting: Neurological Surgery

## 2020-12-07 ENCOUNTER — Inpatient Hospital Stay (HOSPITAL_COMMUNITY)
Admission: RE | Admit: 2020-12-07 | Discharge: 2020-12-08 | DRG: 025 | Disposition: A | Discharge status: Home / Self Care | Payer: 59 | Attending: Neurological Surgery | Admitting: Neurological Surgery

## 2020-12-07 ENCOUNTER — Inpatient Hospital Stay (HOSPITAL_COMMUNITY): Payer: 59 | Admitting: Critical Care Medicine

## 2020-12-07 DIAGNOSIS — I618 Other nontraumatic intracerebral hemorrhage: Secondary | ICD-10-CM | POA: Diagnosis present

## 2020-12-07 DIAGNOSIS — G939 Disorder of brain, unspecified: Secondary | ICD-10-CM | POA: Diagnosis present

## 2020-12-07 DIAGNOSIS — Z20822 Contact with and (suspected) exposure to covid-19: Secondary | ICD-10-CM | POA: Diagnosis present

## 2020-12-07 HISTORY — PX: APPLICATION OF CRANIAL NAVIGATION: SHX6578

## 2020-12-07 HISTORY — PX: CRANIOTOMY: SHX93

## 2020-12-07 LAB — POCT I-STAT 7, (LYTES, BLD GAS, ICA,H+H)
Acid-Base Excess: 3 mmol/L — ABNORMAL HIGH (ref 0.0–2.0)
Bicarbonate: 29.3 mmol/L — ABNORMAL HIGH (ref 20.0–28.0)
Calcium, Ion: 1.16 mmol/L (ref 1.15–1.40)
HCT: 49 % (ref 39.0–52.0)
Hemoglobin: 16.7 g/dL (ref 13.0–17.0)
O2 Saturation: 100 %
Patient temperature: 36.6
Potassium: 4.8 mmol/L (ref 3.5–5.1)
Sodium: 131 mmol/L — ABNORMAL LOW (ref 135–145)
TCO2: 31 mmol/L (ref 22–32)
pCO2 arterial: 50.5 mmHg — ABNORMAL HIGH (ref 32.0–48.0)
pH, Arterial: 7.37 (ref 7.350–7.450)
pO2, Arterial: 194 mmHg — ABNORMAL HIGH (ref 83.0–108.0)

## 2020-12-07 LAB — CBC
HCT: 46.6 % (ref 39.0–52.0)
Hemoglobin: 14.9 g/dL (ref 13.0–17.0)
MCH: 29.9 pg (ref 26.0–34.0)
MCHC: 32 g/dL (ref 30.0–36.0)
MCV: 93.6 fL (ref 80.0–100.0)
Platelets: 225 10*3/uL (ref 150–400)
RBC: 4.98 MIL/uL (ref 4.22–5.81)
RDW: 13.5 % (ref 11.5–15.5)
WBC: 24.8 10*3/uL — ABNORMAL HIGH (ref 4.0–10.5)
nRBC: 0 % (ref 0.0–0.2)

## 2020-12-07 LAB — ABO/RH: ABO/RH(D): O POS

## 2020-12-07 LAB — CREATININE, SERUM
Creatinine, Ser: 0.82 mg/dL (ref 0.61–1.24)
GFR, Estimated: >60 mL/min (ref >60)

## 2020-12-07 SURGERY — CRANIOTOMY TUMOR EXCISION
Anesthesia: Monitor Anesthesia Care

## 2020-12-07 MED ORDER — SODIUM CHLORIDE 0.9 % IV SOLN
0.0500 ug/kg/min | Freq: Once | INTRAVENOUS | Status: DC
  Filled 2020-12-07: qty 5000

## 2020-12-07 MED ORDER — FENTANYL CITRATE (PF) 250 MCG/5ML IJ SOLN
INTRAMUSCULAR | Status: AC
  Filled 2020-12-07: qty 5

## 2020-12-07 MED ORDER — THROMBIN 20000 UNITS EX SOLR
CUTANEOUS | Status: AC
  Filled 2020-12-07: qty 20000

## 2020-12-07 MED ORDER — ONDANSETRON HCL 4 MG/2ML IJ SOLN: 4.0000 mg | INTRAMUSCULAR | Status: DC | PRN

## 2020-12-07 MED ORDER — METOPROLOL TARTRATE 5 MG/5ML IV SOLN
INTRAVENOUS | Status: AC
  Filled 2020-12-07: qty 5

## 2020-12-07 MED ORDER — GADOBUTROL 1 MMOL/ML IV SOLN
10.0000 mL | Freq: Once | INTRAVENOUS | Status: AC | PRN
  Administered 2020-12-07: 10 mL via INTRAVENOUS

## 2020-12-07 MED ORDER — ROCURONIUM BROMIDE 10 MG/ML (PF) SYRINGE
PREFILLED_SYRINGE | INTRAVENOUS | Status: AC
  Filled 2020-12-07: qty 10

## 2020-12-07 MED ORDER — BUPIVACAINE HCL 0.5 % IJ SOLN
INTRAMUSCULAR | Status: DC | PRN
  Administered 2020-12-07: 20 mL

## 2020-12-07 MED ORDER — FENTANYL CITRATE (PF) 100 MCG/2ML IJ SOLN
25.0000 ug | INTRAMUSCULAR | Status: DC | PRN
Start: 2020-12-07 — End: 2020-12-07

## 2020-12-07 MED ORDER — SODIUM CHLORIDE 0.9 % IV SOLN: INTRAVENOUS | Status: DC

## 2020-12-07 MED ORDER — ORAL CARE MOUTH RINSE: 15.0000 mL | Freq: Once | OROMUCOSAL | Status: AC

## 2020-12-07 MED ORDER — PROMETHAZINE HCL 12.5 MG PO TABS
12.5000 mg | ORAL_TABLET | ORAL | Status: DC | PRN
  Filled 2020-12-07: qty 2

## 2020-12-07 MED ORDER — MANNITOL 25 % IV SOLN
INTRAVENOUS | Status: DC | PRN
  Administered 2020-12-07: 60 g via INTRAVENOUS

## 2020-12-07 MED ORDER — LIDOCAINE HCL (PF) 0.5 % IJ SOLN
INTRAMUSCULAR | Status: AC
  Filled 2020-12-07: qty 50

## 2020-12-07 MED ORDER — CLEVIDIPINE BUTYRATE 0.5 MG/ML IV EMUL
1.0000 mg/h | INTRAVENOUS | Status: DC
  Administered 2020-12-07: 08:00:00 1 mg/h via INTRAVENOUS
  Filled 2020-12-07 (×2): qty 100

## 2020-12-07 MED ORDER — LIDOCAINE 2% (20 MG/ML) 5 ML SYRINGE
INTRAMUSCULAR | Status: AC
  Filled 2020-12-07: qty 5

## 2020-12-07 MED ORDER — MIDAZOLAM HCL 5 MG/5ML IJ SOLN
INTRAMUSCULAR | Status: DC | PRN
  Administered 2020-12-07: 1 mg via INTRAVENOUS
  Administered 2020-12-07: 2 mg via INTRAVENOUS

## 2020-12-07 MED ORDER — DEXAMETHASONE SODIUM PHOSPHATE 10 MG/ML IJ SOLN
INTRAMUSCULAR | Status: AC
  Filled 2020-12-07: qty 1

## 2020-12-07 MED ORDER — BACITRACIN ZINC 500 UNIT/GM EX OINT
TOPICAL_OINTMENT | CUTANEOUS | Status: DC | PRN
  Administered 2020-12-07: 1 via TOPICAL

## 2020-12-07 MED ORDER — ACETAMINOPHEN 650 MG RE SUPP: 650.0000 mg | RECTAL | Status: DC | PRN

## 2020-12-07 MED ORDER — DOCUSATE SODIUM 100 MG PO CAPS
100.0000 mg | ORAL_CAPSULE | Freq: Two times a day (BID) | ORAL | Status: DC
  Administered 2020-12-07 – 2020-12-08 (×3): 100 mg via ORAL
  Filled 2020-12-07 (×3): qty 1

## 2020-12-07 MED ORDER — POLYETHYLENE GLYCOL 3350 17 G PO PACK: 17.0000 g | PACK | Freq: Every day | ORAL | Status: DC | PRN

## 2020-12-07 MED ORDER — MIDAZOLAM HCL 2 MG/2ML IJ SOLN
INTRAMUSCULAR | Status: AC
  Filled 2020-12-07: qty 2

## 2020-12-07 MED ORDER — LABETALOL HCL 5 MG/ML IV SOLN
10.0000 mg | INTRAVENOUS | Status: DC | PRN
Start: 2020-12-07 — End: 2020-12-08
  Administered 2020-12-07: 14:00:00 20 mg via INTRAVENOUS
  Filled 2020-12-07 (×3): qty 4

## 2020-12-07 MED ORDER — LEVETIRACETAM 500 MG PO TABS
500.0000 mg | ORAL_TABLET | Freq: Two times a day (BID) | ORAL | Status: DC
  Administered 2020-12-07 – 2020-12-08 (×2): 500 mg via ORAL
  Filled 2020-12-07 (×2): qty 1

## 2020-12-07 MED ORDER — HEPARIN SODIUM (PORCINE) 5000 UNIT/ML IJ SOLN: 5000.0000 [IU] | Freq: Three times a day (TID) | INTRAMUSCULAR | Status: DC

## 2020-12-07 MED ORDER — THROMBIN 5000 UNITS EX SOLR
CUTANEOUS | Status: AC
  Filled 2020-12-07: qty 5000

## 2020-12-07 MED ORDER — THROMBIN 5000 UNITS EX SOLR
OROMUCOSAL | Status: DC | PRN
  Administered 2020-12-07: 08:00:00 5 mL via TOPICAL

## 2020-12-07 MED ORDER — CHLORHEXIDINE GLUCONATE CLOTH 2 % EX PADS
6.0000 | MEDICATED_PAD | Freq: Every day | CUTANEOUS | Status: DC
  Administered 2020-12-08: 12:00:00 6 via TOPICAL

## 2020-12-07 MED ORDER — HYDROCODONE-ACETAMINOPHEN 5-325 MG PO TABS
1.0000 | ORAL_TABLET | ORAL | Status: DC | PRN
  Administered 2020-12-07: 1 via ORAL
  Filled 2020-12-07: qty 1

## 2020-12-07 MED ORDER — ACETAMINOPHEN 325 MG PO TABS: 650.0000 mg | ORAL_TABLET | ORAL | Status: DC | PRN

## 2020-12-07 MED ORDER — HYDROMORPHONE HCL 1 MG/ML IJ SOLN
INTRAMUSCULAR | Status: AC
  Filled 2020-12-07: qty 0.5

## 2020-12-07 MED ORDER — SODIUM CHLORIDE 0.9 % IV SOLN: INTRAVENOUS | Status: DC | PRN

## 2020-12-07 MED ORDER — ONDANSETRON HCL 4 MG/2ML IJ SOLN
INTRAMUSCULAR | Status: AC
  Filled 2020-12-07: qty 2

## 2020-12-07 MED ORDER — LIDOCAINE HCL 0.5 % IJ SOLN
INTRAMUSCULAR | Status: DC | PRN
  Administered 2020-12-07: 4 mL
  Administered 2020-12-07: 15 mL

## 2020-12-07 MED ORDER — FAMOTIDINE IN NACL 20-0.9 MG/50ML-% IV SOLN
20.0000 mg | Freq: Two times a day (BID) | INTRAVENOUS | Status: DC
  Administered 2020-12-07 – 2020-12-08 (×3): 20 mg via INTRAVENOUS
  Filled 2020-12-07 (×4): qty 50

## 2020-12-07 MED ORDER — CHLORHEXIDINE GLUCONATE CLOTH 2 % EX PADS: 6.0000 | MEDICATED_PAD | Freq: Once | CUTANEOUS | Status: DC

## 2020-12-07 MED ORDER — LEVETIRACETAM IN NACL 500 MG/100ML IV SOLN
500.0000 mg | Freq: Once | INTRAVENOUS | Status: DC
  Filled 2020-12-07: qty 100

## 2020-12-07 MED ORDER — LACTATED RINGERS IV SOLN: INTRAVENOUS | Status: DC | PRN

## 2020-12-07 MED ORDER — BACITRACIN ZINC 500 UNIT/GM EX OINT
TOPICAL_OINTMENT | CUTANEOUS | Status: AC
  Filled 2020-12-07: qty 28.35

## 2020-12-07 MED ORDER — CEFAZOLIN SODIUM-DEXTROSE 2-4 GM/100ML-% IV SOLN
2.0000 g | Freq: Three times a day (TID) | INTRAVENOUS | Status: AC
  Administered 2020-12-07 – 2020-12-08 (×2): 2 g via INTRAVENOUS
  Filled 2020-12-07 (×3): qty 100

## 2020-12-07 MED ORDER — PROPOFOL 10 MG/ML IV BOLUS
INTRAVENOUS | Status: DC | PRN
  Administered 2020-12-07: 50 mg via INTRAVENOUS
  Administered 2020-12-07 (×2): 40 mg via INTRAVENOUS
  Administered 2020-12-07: 10 mg via INTRAVENOUS
  Administered 2020-12-07: 30 mg via INTRAVENOUS
  Administered 2020-12-07: 10 mg via INTRAVENOUS
  Administered 2020-12-07: 20 mg via INTRAVENOUS
  Administered 2020-12-07: 30 mg via INTRAVENOUS
  Administered 2020-12-07: 40 mg via INTRAVENOUS
  Administered 2020-12-07: 50 mg via INTRAVENOUS

## 2020-12-07 MED ORDER — LABETALOL HCL 5 MG/ML IV SOLN
INTRAVENOUS | Status: DC | PRN
  Administered 2020-12-07: 5 mg via INTRAVENOUS
  Administered 2020-12-07: 10 mg via INTRAVENOUS

## 2020-12-07 MED ORDER — 0.9 % SODIUM CHLORIDE (POUR BTL) OPTIME
TOPICAL | Status: DC | PRN
  Administered 2020-12-07 (×4): 1000 mL

## 2020-12-07 MED ORDER — SODIUM CHLORIDE 0.9 % IV SOLN
INTRAVENOUS | Status: DC | PRN
  Administered 2020-12-07: 09:00:00 500 mg via INTRAVENOUS

## 2020-12-07 MED ORDER — REMIFENTANIL HCL 2 MG IV SOLR
INTRAVENOUS | Status: DC | PRN
  Administered 2020-12-07: .05 ug/kg/min via INTRAVENOUS

## 2020-12-07 MED ORDER — LABETALOL HCL 5 MG/ML IV SOLN
INTRAVENOUS | Status: AC
  Filled 2020-12-07: qty 4

## 2020-12-07 MED ORDER — BUPIVACAINE HCL (PF) 0.5 % IJ SOLN
INTRAMUSCULAR | Status: AC
  Filled 2020-12-07: qty 30

## 2020-12-07 MED ORDER — DEXAMETHASONE SODIUM PHOSPHATE 10 MG/ML IJ SOLN
INTRAMUSCULAR | Status: DC | PRN
  Administered 2020-12-07: 6 mg via INTRAVENOUS

## 2020-12-07 MED ORDER — METOPROLOL TARTRATE 5 MG/5ML IV SOLN
INTRAVENOUS | Status: DC | PRN
  Administered 2020-12-07: 2 mg via INTRAVENOUS
  Administered 2020-12-07: 3 mg via INTRAVENOUS

## 2020-12-07 MED ORDER — DEXAMETHASONE 4 MG PO TABS
4.0000 mg | ORAL_TABLET | Freq: Four times a day (QID) | ORAL | Status: DC
  Administered 2020-12-07 – 2020-12-08 (×4): 4 mg via ORAL
  Filled 2020-12-07 (×4): qty 1

## 2020-12-07 MED ORDER — CHLORHEXIDINE GLUCONATE 0.12 % MT SOLN
15.0000 mL | Freq: Once | OROMUCOSAL | Status: AC
  Administered 2020-12-07: 06:00:00 15 mL via OROMUCOSAL
  Filled 2020-12-07: qty 15

## 2020-12-07 MED ORDER — HYDROMORPHONE HCL 1 MG/ML IJ SOLN
0.5000 mg | INTRAMUSCULAR | Status: DC | PRN
Start: 2020-12-07 — End: 2020-12-08

## 2020-12-07 MED ORDER — ONDANSETRON HCL 4 MG PO TABS: 4.0000 mg | ORAL_TABLET | ORAL | Status: DC | PRN

## 2020-12-07 MED ORDER — PROPOFOL 500 MG/50ML IV EMUL
INTRAVENOUS | Status: DC | PRN
  Administered 2020-12-07: 50 ug/kg/min via INTRAVENOUS

## 2020-12-07 MED ORDER — CEFAZOLIN SODIUM-DEXTROSE 2-4 GM/100ML-% IV SOLN
2.0000 g | INTRAVENOUS | Status: AC
  Administered 2020-12-07 (×2): 2 g via INTRAVENOUS
  Filled 2020-12-07: qty 100

## 2020-12-07 MED ORDER — THROMBIN 20000 UNITS EX SOLR
CUTANEOUS | Status: DC | PRN
  Administered 2020-12-07: 08:00:00 20 mL via TOPICAL

## 2020-12-07 MED ORDER — PROPOFOL 10 MG/ML IV BOLUS
INTRAVENOUS | Status: AC
  Filled 2020-12-07: qty 20

## 2020-12-07 MED ORDER — HYDROMORPHONE HCL 1 MG/ML IJ SOLN
INTRAMUSCULAR | Status: DC | PRN
  Administered 2020-12-07 (×2): .5 mg via INTRAVENOUS

## 2020-12-07 SURGICAL SUPPLY — 108 items
BAND RUBBER #18 3X1/16 STRL (MISCELLANEOUS) ×8 IMPLANT
BENZOIN TINCTURE PRP APPL 2/3 (GAUZE/BANDAGES/DRESSINGS) IMPLANT
BLADE CLIPPER SURG (BLADE) ×4 IMPLANT
BLADE SAW GIGLI 16 STRL (MISCELLANEOUS) IMPLANT
BLADE SURG 15 STRL LF DISP TIS (BLADE) IMPLANT
BLADE SURG 15 STRL SS (BLADE)
BNDG GAUZE ELAST 4 BULKY (GAUZE/BANDAGES/DRESSINGS) IMPLANT
BNDG STRETCH 4X75 STRL LF (GAUZE/BANDAGES/DRESSINGS) IMPLANT
BUR ACORN 9.0 PRECISION (BURR) ×3 IMPLANT
BUR ACORN 9.0MM PRECISION (BURR) ×1
BUR ROUND FLUTED 4 SOFT TCH (BURR) ×3 IMPLANT
BUR ROUND FLUTED 4MM SOFT TCH (BURR) ×1
BUR SPIRAL ROUTER 2.3 (BUR) ×3 IMPLANT
BUR SPIRAL ROUTER 2.3MM (BUR) ×1
CANISTER SUCT 3000ML PPV (MISCELLANEOUS) ×8 IMPLANT
CATH VENTRIC 35X38 W/TROCAR LG (CATHETERS) IMPLANT
CLIP VESOCCLUDE MED 6/CT (CLIP) IMPLANT
CNTNR URN SCR LID CUP LEK RST (MISCELLANEOUS) ×2 IMPLANT
CONT SPEC 4OZ STRL OR WHT (MISCELLANEOUS) ×4
COVER BURR HOLE UNIV 10 (Orthopedic Implant) ×4 IMPLANT
COVER MAYO STAND STRL (DRAPES) IMPLANT
COVER WAND RF STERILE (DRAPES) ×4 IMPLANT
DECANTER SPIKE VIAL GLASS SM (MISCELLANEOUS) ×4 IMPLANT
DRAIN SUBARACHNOID (WOUND CARE) IMPLANT
DRAPE HALF SHEET 40X57 (DRAPES) ×4 IMPLANT
DRAPE MICROSCOPE LEICA (MISCELLANEOUS) ×4 IMPLANT
DRAPE NEUROLOGICAL W/INCISE (DRAPES) ×4 IMPLANT
DRAPE STERI IOBAN 125X83 (DRAPES) IMPLANT
DRAPE SURG 17X23 STRL (DRAPES) IMPLANT
DRAPE WARM FLUID 44X44 (DRAPES) ×4 IMPLANT
DRSG ADAPTIC 3X8 NADH LF (GAUZE/BANDAGES/DRESSINGS) IMPLANT
DRSG TELFA 3X8 NADH (GAUZE/BANDAGES/DRESSINGS) IMPLANT
DURAPREP 6ML APPLICATOR 50/CS (WOUND CARE) ×4 IMPLANT
ELECT REM PT RETURN 9FT ADLT (ELECTROSURGICAL) ×4
ELECTRODE REM PT RTRN 9FT ADLT (ELECTROSURGICAL) ×2 IMPLANT
EVACUATOR 1/8 PVC DRAIN (DRAIN) IMPLANT
EVACUATOR SILICONE 100CC (DRAIN) IMPLANT
FEE INTRAOP MONITOR IMPULS NCS (MISCELLANEOUS) ×2 IMPLANT
FORCEPS BIPOLAR SPETZLER 8 1.0 (NEUROSURGERY SUPPLIES) ×4 IMPLANT
GAUZE 4X4 16PLY RFD (DISPOSABLE) IMPLANT
GAUZE SPONGE 4X4 12PLY STRL (GAUZE/BANDAGES/DRESSINGS) IMPLANT
GLOVE BIO SURGEON STRL SZ 6.5 (GLOVE) ×6 IMPLANT
GLOVE BIO SURGEON STRL SZ7 (GLOVE) IMPLANT
GLOVE BIO SURGEON STRL SZ7.5 (GLOVE) ×4 IMPLANT
GLOVE BIO SURGEONS STRL SZ 6.5 (GLOVE) ×2
GLOVE BIOGEL PI IND STRL 6.5 (GLOVE) ×4 IMPLANT
GLOVE BIOGEL PI IND STRL 7.0 (GLOVE) IMPLANT
GLOVE BIOGEL PI IND STRL 7.5 (GLOVE) ×2 IMPLANT
GLOVE BIOGEL PI INDICATOR 6.5 (GLOVE) ×4
GLOVE BIOGEL PI INDICATOR 7.0 (GLOVE)
GLOVE BIOGEL PI INDICATOR 7.5 (GLOVE) ×2
GLOVE ECLIPSE 7.5 STRL STRAW (GLOVE) ×4 IMPLANT
GLOVE EXAM NITRILE LRG STRL (GLOVE) IMPLANT
GLOVE EXAM NITRILE XS STR PU (GLOVE) IMPLANT
GLOVE SURG SS PI 7.5 STRL IVOR (GLOVE) ×20 IMPLANT
GLOVE SURG SS PI 8.0 STRL IVOR (GLOVE) ×4 IMPLANT
GOWN STRL REUS W/ TWL LRG LVL3 (GOWN DISPOSABLE) ×4 IMPLANT
GOWN STRL REUS W/ TWL XL LVL3 (GOWN DISPOSABLE) ×2 IMPLANT
GOWN STRL REUS W/TWL 2XL LVL3 (GOWN DISPOSABLE) IMPLANT
GOWN STRL REUS W/TWL LRG LVL3 (GOWN DISPOSABLE) ×8
GOWN STRL REUS W/TWL XL LVL3 (GOWN DISPOSABLE) ×4
HEMOSTAT POWDER KIT SURGIFOAM (HEMOSTASIS) ×4 IMPLANT
HEMOSTAT SURGICEL 2X14 (HEMOSTASIS) ×4 IMPLANT
INTRAOP MONITOR FEE IMPULS NCS (MISCELLANEOUS) ×2
INTRAOP MONITOR FEE IMPULSE (MISCELLANEOUS) ×4
IV NS 1000ML (IV SOLUTION)
IV NS 1000ML BAXH (IV SOLUTION) IMPLANT
KIT BASIN OR (CUSTOM PROCEDURE TRAY) ×4 IMPLANT
KIT DRAIN CSF ACCUDRAIN (MISCELLANEOUS) IMPLANT
KIT TURNOVER KIT B (KITS) ×4 IMPLANT
MARKER SPHERE PSV REFLC 13MM (MARKER) ×12 IMPLANT
NEEDLE HYPO 22GX1.5 SAFETY (NEEDLE) ×4 IMPLANT
NEEDLE SPNL 18GX3.5 QUINCKE PK (NEEDLE) IMPLANT
NS IRRIG 1000ML POUR BTL (IV SOLUTION) ×16 IMPLANT
PACK CRANIOTOMY CUSTOM (CUSTOM PROCEDURE TRAY) ×4 IMPLANT
PATTIES SURGICAL .25X.25 (GAUZE/BANDAGES/DRESSINGS) IMPLANT
PATTIES SURGICAL .5 X.5 (GAUZE/BANDAGES/DRESSINGS) ×4 IMPLANT
PATTIES SURGICAL .5 X3 (DISPOSABLE) IMPLANT
PATTIES SURGICAL 1/4 X 3 (GAUZE/BANDAGES/DRESSINGS) IMPLANT
PATTIES SURGICAL 1X1 (DISPOSABLE) IMPLANT
PIN MAYFIELD SKULL DISP (PIN) ×4 IMPLANT
PLATE DOUBLE Y CMF 6H (Plate) ×8 IMPLANT
PROBE NERVE STIM BT 2X80 (NEUROSURGERY SUPPLIES) ×4
PROBE NERVE STIM BT 2X80 NCS (NEUROSURGERY SUPPLIES) ×2 IMPLANT
SCREW UNIII AXS SD 1.5X4 (Screw) ×52 IMPLANT
SPECIMEN JAR SMALL (MISCELLANEOUS) ×4 IMPLANT
SPONGE LAP 4X18 RFD (DISPOSABLE) ×4 IMPLANT
SPONGE NEURO XRAY DETECT 1X3 (DISPOSABLE) IMPLANT
SPONGE SURGIFOAM ABS GEL 100 (HEMOSTASIS) ×4 IMPLANT
STAPLER VISISTAT 35W (STAPLE) ×4 IMPLANT
STRIP PLATINUM NCS 1X4 (MISCELLANEOUS) ×4
STRIP PLATINUM NCS 1X4 NCS (MISCELLANEOUS) ×2 IMPLANT
SUT ETHILON 3 0 FSL (SUTURE) IMPLANT
SUT ETHILON 3 0 PS 1 (SUTURE) IMPLANT
SUT MNCRL AB 3-0 PS2 18 (SUTURE) ×8 IMPLANT
SUT MON AB 3-0 SH 27 (SUTURE)
SUT MON AB 3-0 SH27 (SUTURE) IMPLANT
SUT NURALON 4 0 TR CR/8 (SUTURE) ×4 IMPLANT
SUT SILK 0 TIES 10X30 (SUTURE) IMPLANT
SUT VIC AB 2-0 CP2 18 (SUTURE) IMPLANT
SUT VIC AB 2-0 CT1 18 (SUTURE) ×20 IMPLANT
TOWEL GREEN STERILE (TOWEL DISPOSABLE) ×4 IMPLANT
TOWEL GREEN STERILE FF (TOWEL DISPOSABLE) ×4 IMPLANT
TRAY FOLEY MTR SLVR 16FR STAT (SET/KITS/TRAYS/PACK) ×4 IMPLANT
TUBE CONNECTING 12'X1/4 (SUCTIONS) ×1
TUBE CONNECTING 12X1/4 (SUCTIONS) ×3 IMPLANT
UNDERPAD 30X36 HEAVY ABSORB (UNDERPADS AND DIAPERS) IMPLANT
WATER STERILE IRR 1000ML POUR (IV SOLUTION) ×4 IMPLANT

## 2020-12-07 NOTE — Progress Notes (Addendum)
Spoke to Dr Nyoka Cowden about patients pre-op blood pressure, she stated she will talk to Dr. Zada Finders.    7:10 AM Dr Nyoka Cowden spoke with Dr. Zada Finders. Plan to proceed with procedure today

## 2020-12-07 NOTE — Anesthesia Postprocedure Evaluation (Signed)
Anesthesia Post Note  Patient: Francis Flores  Procedure(s) Performed: Left awake craniotomy for tumor resection (Left ) APPLICATION OF CRANIAL NAVIGATION (N/A )     Patient location during evaluation: PACU Anesthesia Type: MAC Level of consciousness: awake Pain management: pain level controlled Vital Signs Assessment: post-procedure vital signs reviewed and stable Respiratory status: spontaneous breathing Cardiovascular status: stable Postop Assessment: no apparent nausea or vomiting Anesthetic complications: no   No complications documented.  Last Vitals:  Vitals:   12/07/20 1245 12/07/20 1300  BP:    Pulse: (!) 102 84  Resp: 12 12  Temp:    SpO2: 97% 96%    Last Pain:  Vitals:   12/07/20 1245  TempSrc:   PainSc: 4                  Rejoice Heatwole

## 2020-12-07 NOTE — Op Note (Signed)
PATIENT: Francis Flores  DAY OF SURGERY: 12/07/20   PRE-OPERATIVE DIAGNOSIS:  Left temporal hemorrhagic mass   POST-OPERATIVE DIAGNOSIS:  Same   PROCEDURE:  Left awake craniotomy with frameless stereotaxy, intra-operative language mapping and electrocorticography, use of operating microscope   SURGEON:  Surgeon(s) and Role:    Jadene Pierini, MD - Primary    Barnett Abu, MD - Assisting   ANESTHESIA: ETGA   BRIEF HISTORY: This is a 39 year old man who presented with a GTC. The patient was found to have a left temporal hemorrhagic mass that expanded on repeat imaging. I therefore recommended craniotomy for resection, to evacuate the hematoma, and address the underlying abnormality leading to the hemorrhage. Given the location, I recommended an awake craniotomy to decrease the risk of speech deficits. This was discussed with the patient as well as risks, benefits, and alternatives and wished to proceed with surgery.   OPERATIVE DETAIL: The patient was taken to the operating room and placed on the OR table in the supine position. A formal time out was performed with two patient identifiers and confirmed the operative site. Sedation was induced by the anesthesia team.   Prior to placement with the Mayfield head holder, the pin sites were anesthetized with a combination of long and short acting local anesthetic.   The Mayfield head holder was applied to the head and a registration array was attached to the Mayfield. This was co-registered with the patient's preoperative imaging, the fit appeared to be acceptable. Using frameless stereotaxy, the operative trajectory was planned and the incision was marked. Given the location of the incision, local anesthetic was used to block the auriculotemporal, greater, and lesser occipital nerves.   Hair was clipped with surgical clippers over the incision and the area was then prepped and draped in a sterile fashion.  A linear incision was placed in  the left posterior temporal region. The scalp and temporalis were impressively vascular and his scalp was too thick to fit into a standard Raney clip. I therefore asked for the cell saver to be brought into the room in case the ooze continued for the whole case. Roughly 500cc was lost from opening and represented almost the entire EBL for the case. A craniotomy flap was turned, centered over the mass using frameless stereotaxy for guidance. After hemostasis was obtained, the dura was opened and flapped.   As soon as the dura was opened, a hematoma under pressure delivered itself and was oozing. This was partially evacuated to decrease the tension of the brain. My standard awake craniotomy protocol was followed with placement of an electrocorticography grid was placed over the cortex to check for ADPs, preoperative administration of AEDs and steroids, with ice cold saline available on the field. Mapping was done with progressive amperage starting at 36mA. At 54mA, speech arrest was obtained and during confirmatory testing, he did have some prolonged speech arrest with right sided clonic movements. This terminated with cold saline and there were no further seizures with mapping. The hematoma delivery site / corticotomy was followed to the lesion and, with mapping as needed, it was dissected circumferentially and resected en bloc. The mass appeared to be organized hematoma with a capsule. The microscope was brought into the field. 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 After gross total resection, inspection of the walls was performed with the microscope as well  as frameless stereotaxy.  With resection complete and mapping complete, the patient was again sedated for closure. Hemostasis was confirmed, the area was irrigated with sterile saline, the dura was closed, and the bone flap  was replaced with titanium plates and screws.   All instrument and sponge counts were correct, the incision was then closed in layers and the Mayfield head holder was removed. No apparent complications at the completion of the procedure.   EBL:  55mL   DRAINS: none   SPECIMENS: Left temporal hemorrhagic mass   Judith Part, MD 12/07/20 7:15 AM

## 2020-12-07 NOTE — Anesthesia Procedure Notes (Signed)
Arterial Line Insertion Start/End12/21/2021 6:45 AM, 12/07/2020 7:00 AM Performed by: Amadeo Garnet, CRNA, CRNA  Preanesthetic checklist: patient identified, IV checked, site marked, risks and benefits discussed, surgical consent, monitors and equipment checked, pre-op evaluation and timeout performed Lidocaine 1% used for infiltration Right, radial was placed Catheter size: 20 G Hand hygiene performed  and maximum sterile barriers used   Attempts: 1 Procedure performed without using ultrasound guided technique. Ultrasound Notes:anatomy identified Following insertion, dressing applied and Biopatch. Post procedure assessment: normal  Patient tolerated the procedure well with no immediate complications.

## 2020-12-07 NOTE — Anesthesia Postprocedure Evaluation (Signed)
Anesthesia Post Note  Patient: Francis Flores  Procedure(s) Performed: Left awake craniotomy for tumor resection (Left ) APPLICATION OF CRANIAL NAVIGATION (N/A )     Patient location during evaluation: PACU Anesthesia Type: MAC Level of consciousness: awake Pain management: pain level controlled Vital Signs Assessment: post-procedure vital signs reviewed and stable Respiratory status: spontaneous breathing Cardiovascular status: stable Postop Assessment: no apparent nausea or vomiting Anesthetic complications: no   No complications documented.  Last Vitals:  Vitals:   12/07/20 1400 12/07/20 1500  BP: (!) 137/97 122/76  Pulse:  84  Resp: 14 12  Temp:    SpO2:  95%    Last Pain:  Vitals:   12/07/20 1400  TempSrc:   PainSc: 5                  Avrian Delfavero

## 2020-12-07 NOTE — Anesthesia Procedure Notes (Signed)
Procedure Name: MAC Date/Time: 12/07/2020 7:55 AM Performed by: Amadeo Garnet, CRNA Pre-anesthesia Checklist: Patient identified, Suction available, Emergency Drugs available and Patient being monitored Patient Re-evaluated:Patient Re-evaluated prior to induction Oxygen Delivery Method: Simple face mask Preoxygenation: Pre-oxygenation with 100% oxygen Induction Type: IV induction Placement Confirmation: positive ETCO2 Dental Injury: Teeth and Oropharynx as per pre-operative assessment

## 2020-12-07 NOTE — Anesthesia Preprocedure Evaluation (Signed)
Anesthesia Evaluation  Patient identified by MRN, date of birth, ID band Patient awake    Reviewed: Allergy & Precautions, NPO status , Patient's Chart, lab work & pertinent test results  Airway Mallampati: II  TM Distance: >3 FB     Dental   Pulmonary neg pulmonary ROS,    breath sounds clear to auscultation       Cardiovascular negative cardio ROS   Rhythm:Regular Rate:Normal     Neuro/Psych Seizures -,     GI/Hepatic negative GI ROS, Neg liver ROS,   Endo/Other  negative endocrine ROS  Renal/GU negative Renal ROS     Musculoskeletal   Abdominal   Peds  Hematology   Anesthesia Other Findings   Reproductive/Obstetrics                             Anesthesia Physical Anesthesia Plan  ASA: III  Anesthesia Plan: MAC   Post-op Pain Management:    Induction: Intravenous  PONV Risk Score and Plan: 2 and Ondansetron, Dexamethasone and Midazolam  Airway Management Planned: Nasal Cannula and Simple Face Mask  Additional Equipment:   Intra-op Plan:   Post-operative Plan:   Informed Consent: I have reviewed the patients History and Physical, chart, labs and discussed the procedure including the risks, benefits and alternatives for the proposed anesthesia with the patient or authorized representative who has indicated his/her understanding and acceptance.     Dental advisory given  Plan Discussed with: CRNA and Anesthesiologist  Anesthesia Plan Comments:         Anesthesia Quick Evaluation

## 2020-12-07 NOTE — H&P (Signed)
Surgical H&P Update  HPI: 39 y.o. man that originally presented with GTC, found to have a left hemorrhagic mass that increased in size / hemorrhage on f/u imaging, here for craniotomy and resection. No changes in health since he was last seen, no recent breakthrough seizures.   PMHx:  Past Medical History:  Diagnosis Date  . Seizure-like activity ([place])    FamHx: History reviewed. No pertinent family history. SocHx:  reports that he has never smoked. He has never used smokeless tobacco. He reports previous drug use. He reports that he does not drink alcohol.  Physical Exam: AOx3, PERRL, FS, TM, speech fluent with normal content  Strength 5/5 x4, SILTx4  Assesment/Plan: 39 y.o. man with left temporal hemorrhagic mass, here for awake crani for resection. Risks, benefits, and alternatives discussed and the patient would like to continue with surgery.  -OR today -4N post-op  Judith Part, MD 12/07/20 7:14 AM

## 2020-12-07 NOTE — Brief Op Note (Signed)
12/07/2020  12:34 PM  PATIENT:  Francis Flores  39 y.o. male  PRE-OPERATIVE DIAGNOSIS:  Brain tumor  POST-OPERATIVE DIAGNOSIS:  Brain tumor  PROCEDURE:  Procedure(s): Left awake craniotomy for tumor resection (Left) APPLICATION OF CRANIAL NAVIGATION (N/A)  SURGEON:  Surgeon(s) and Role:    * Sirius Woodford, Joyice Faster, MD - Primary    * Kristeen Miss, MD - Assisting  PHYSICIAN ASSISTANT:   ASSISTANTSKristeen Miss   ANESTHESIA:   local and IV sedation  EBL:  500 mL   BLOOD ADMINISTERED:none  DRAINS: none   LOCAL MEDICATIONS USED:  BUPIVICAINE  and LIDOCAINE   SPECIMEN:  Source of Specimen:  Left temporal mass  DISPOSITION OF SPECIMEN:  PATHOLOGY  COUNTS:  YES  TOURNIQUET:  * No tourniquets in log *  DICTATION: .Note written in EPIC  PLAN OF CARE: Admit to inpatient   PATIENT DISPOSITION:  PACU - hemodynamically stable.   Delay start of Pharmacological VTE agent (>24hrs) due to surgical blood loss or risk of bleeding: yes

## 2020-12-07 NOTE — Transfer of Care (Signed)
Immediate Anesthesia Transfer of Care Note  Patient: Francis Flores  Procedure(s) Performed: Left awake craniotomy for tumor resection (Left ) APPLICATION OF CRANIAL NAVIGATION (N/A )  Patient Location: PACU  Anesthesia Type:MAC  Level of Consciousness: awake, alert  and oriented  Airway & Oxygen Therapy: Patient Spontanous Breathing  Post-op Assessment: Report given to RN, Post -op Vital signs reviewed and stable and Patient moving all extremities  Post vital signs: Reviewed and stable  Last Vitals:  Vitals Value Taken Time  BP 156/109 12/07/20 1220  Temp 36.9 C 12/07/20 1220  Pulse 96 12/07/20 1225  Resp 18 12/07/20 1225  SpO2 98 % 12/07/20 1225  Vitals shown include unvalidated device data.  Last Pain:  Vitals:   12/07/20 0556  TempSrc:   PainSc: 0-No pain         Complications: No complications documented.

## 2020-12-08 ENCOUNTER — Telehealth: Payer: Self-pay | Admitting: Diagnostic Neuroimaging

## 2020-12-08 DIAGNOSIS — Z0289 Encounter for other administrative examinations: Secondary | ICD-10-CM

## 2020-12-08 MED ORDER — DEXAMETHASONE 4 MG PO TABS: 4.0000 mg | ORAL_TABLET | Freq: Two times a day (BID) | ORAL | 1 refills | Status: DC

## 2020-12-08 MED ORDER — HYDROCODONE-ACETAMINOPHEN 5-325 MG PO TABS: 1.0000 | ORAL_TABLET | ORAL | 0 refills | Status: DC | PRN

## 2020-12-08 NOTE — Telephone Encounter (Signed)
Pt. Stated his wife will be coming by today to pay for the [facility] form as well as bringing another form.

## 2020-12-08 NOTE — Discharge Instructions (Signed)
Discharge Instructions  No restriction in activities, slowly increase your activity back to normal.   Your incision is closed with dermabond (purple glue). This will naturally fall off over the next 1-2 weeks.   Okay to shower on the day of discharge. Use regular soap and water and try to be gentle when cleaning your incision.   Follow up with Dr. Zada Finders in 2 weeks after discharge. If you do not already have a discharge appointment, please call his office at [phone] to schedule a follow up appointment. If you have any concerns or questions, please call the office and let us know.  Decrease your dexamethasone (Decadron) to 4mg  twice a day. If you are doing well on that dose, after one week (12/29) you can decrease it to 4mg  once a day. If you are doing well on that dose, after one week (1/5) then you can stop taking it.

## 2020-12-08 NOTE — Evaluation (Signed)
Physical Therapy Evaluation Patient Details Name: Francis Flores MRN: [id number] DOB: [birth year] Today's Date: 12/08/2020   History of Present Illness  Pt is a 39 y/o male who initially presented with GTC, found to have a left hemorrhagic mass that increased in size / hemorrhage on follow up imaging. Pt is now s/p L awake craniotomy for tumor resection on 12/21. Pt with no other significant PMHx  Clinical Impression  Pt is at or close to baseline functioning and should be safe at home with/without available assist. There are no further acute PT needs.  Will sign off at this time.     Follow Up Recommendations No PT follow up    Equipment Recommendations  None recommended by PT    Recommendations for Other Services       Precautions / Restrictions Precautions Precautions: Fall (min) Restrictions Weight Bearing Restrictions: No      Mobility  Bed Mobility               General bed mobility comments: OOB in recliner    Transfers Overall transfer level: Independent                  Ambulation/Gait Ambulation/Gait assistance: Independent Gait Distance (Feet): 350 Feet Assistive device: None Gait Pattern/deviations: Step-through pattern   Gait velocity interpretation: >2.62 ft/sec, indicative of community ambulatory General Gait Details: steady gait with moderate challenge and no overt deviation.  Age appropriate gait speeds  Stairs Stairs: Yes Stairs assistance: Independent Stair Management: No rails;Alternating pattern;Forwards Number of Stairs: 7 General stair comments: Steady without rails carrying the telemetry box.  Wheelchair Mobility    Modified Rankin (Stroke Patients Only)       Balance Overall balance assessment: No apparent balance deficits (not formally assessed);Modified Independent                                           Pertinent Vitals/Pain Pain Assessment: Faces Faces Pain Scale: No hurt Pain  Intervention(s): Monitored during session    Home Living Family/patient expects to be discharged to:: Private residence Living Arrangements: Spouse/significant other;Children Available Help at Discharge: Family;Available 24 hours/day Type of Home: House Home Access: Stairs to enter   [employer] of Steps: 2 Home Layout: Two level;Able to live on main level with bedroom/bathroom Home Equipment: None      Prior Function Level of Independence: Independent         Comments: works as a Catering manager Extremity Assessment Upper Extremity Assessment: Overall WFL for tasks assessed    Lower Extremity Assessment Lower Extremity Assessment: Overall WFL for tasks assessed    Cervical / Trunk Assessment Cervical / Trunk Assessment: Normal  Communication   Communication: No difficulties  Cognition Arousal/Alertness: Awake/alert Behavior During Therapy: WFL for tasks assessed/performed Overall Cognitive Status: Within Functional Limits for tasks assessed                                        General Comments      Exercises     Assessment/Plan    PT Assessment Patent does not need any further PT services  PT Problem List         PT  Treatment Interventions      PT Goals (Current goals can be found in the Care Plan section)  Acute Rehab PT Goals Patient Stated Goal: home today PT Goal Formulation: All assessment and education complete, DC therapy    Frequency     Barriers to discharge        Co-evaluation PT/OT/SLP Co-Evaluation/Treatment: Yes Reason for Co-Treatment: For patient/therapist safety PT goals addressed during session: Mobility/safety with mobility OT goals addressed during session: ADL's and self-care       AM-PAC PT "6 Clicks" Mobility  Outcome Measure Help needed turning from your back to your side while in a flat bed without using bedrails?:  None Help needed moving from lying on your back to sitting on the side of a flat bed without using bedrails?: None Help needed moving to and from a bed to a chair (including a wheelchair)?: None Help needed standing up from a chair using your arms (e.g., wheelchair or bedside chair)?: None Help needed to walk in hospital room?: None Help needed climbing 3-5 steps with a railing? : None 6 Click Score: 24    End of Session   Activity Tolerance: Patient tolerated treatment well Patient left: in chair;with call bell/phone within reach;with family/visitor present Nurse Communication: Mobility status PT Visit Diagnosis: Other abnormalities of gait and mobility (R26.89)    Time: 3790-2409 PT Time Calculation (min) (ACUTE ONLY): 16 min   Charges:   PT Evaluation $PT Eval Low Complexity: 1 Low          12/08/2020  Jacinto Halim., PT Acute Rehabilitation Services [phone]  (pager) [phone]  (office)  Eliseo Gum Arella Blinder 12/08/2020, 4:31 PM

## 2020-12-08 NOTE — Progress Notes (Signed)
   12/08/20 0330  What Happened  Was fall witnessed? No  Was patient injured? No  Patient found on floor;in bathroom  Found by Staff-comment Delfin Gant)  Stated prior activity bathroom-unassisted  Follow Up  MD notified [place]  Time MD notified [place] notified No - patient refusal  Additional tests No  Progress note created (see row info) Yes  Adult Fall Risk Assessment  Risk Factor Category (scoring not indicated) High fall risk per protocol (document High fall risk);Fall has occurred during this admission (document High fall risk)  Age 39  Fall History: Fall within 6 months prior to admission 0  Elimination; Bowel and/or Urine Incontinence 0  Elimination; Bowel and/or Urine Urgency/Frequency 0  Medications: includes PCA/Opiates, Anti-convulsants, Anti-hypertensives, Diuretics, Hypnotics, Laxatives, Sedatives, and Psychotropics 7  Patient Care Equipment 2  Mobility-Assistance 2  Mobility-Gait 0  Mobility-Sensory Deficit 0  Altered awareness of immediate physical environment 0  Impulsiveness 0  Lack of understanding of one's physical/cognitive limitations 0  Total Score 11  Patient Fall Risk Level High fall risk  Adult Fall Risk Interventions  Required Bundle Interventions *See Row Information* High fall risk - low, moderate, and high requirements implemented  Additional Interventions Camera surveillance (with patient/family notification & education);PT/OT need assessed if change in mobility from baseline;Room near nurses station;Use of appropriate toileting equipment (bedpan, BSC, etc.)  Screening for Fall Injury Risk (To be completed on HIGH fall risk patients) - Assessing Need for Low Bed  Risk For Fall Injury- Low Bed Criteria None identified - Continue screening  Screening for Fall Injury Risk (To be completed on HIGH fall risk patients who do not meet crieteria for Low Bed) - Assessing Need for Floor Mats Only  Risk For Fall Injury- Criteria for Floor Mats None  identified - No additional interventions needed

## 2020-12-08 NOTE — Evaluation (Signed)
Occupational Therapy Evaluation and Discharge Patient Details Name: Francis Flores MRN: [id number] DOB: 07-09-1981 Today's Date: 12/08/2020    History of Present Illness Pt is a 39 y/o male who initially presented with GTC, found to have a left hemorrhagic mass that increased in size / hemorrhage on follow up imaging. Pt is now s/p L awake craniotomy for tumor resection on 12/21. Pt with no other significant PMHx   Clinical Impression   This 39 y/o male presents with the above. PTA pt independent with ADL, iADL and functional mobility. Today pt presenting close to/at his baseline function. Pt demonstrating ADL, room/hallway level mobility without AD at distant supervision throughout and with no LOB noted. HR up to upper 120s with activity. Educated both pt/pt's spouse on general safety strategies for completing ADL/mobility tasks initially after return home with both parties verbalizing understanding. Pt reports feeling at his baseline at this time. All questions answered with no further OT needs identified. Feel pt is safe to return home once medically able. Acute OT to sign off. Thank you for this referral.     Follow Up Recommendations  No OT follow up;Supervision/Assistance - 24 hour (24hr initially)    Equipment Recommendations  None recommended by OT           Precautions / Restrictions Precautions Precautions: Fall (min) Restrictions Weight Bearing Restrictions: No      Mobility Bed Mobility               General bed mobility comments: OOB in recliner    Transfers Overall transfer level: Independent                    Balance Overall balance assessment: No apparent balance deficits (not formally assessed)                                         ADL either performed or assessed with clinical judgement   ADL Overall ADL's : At baseline                                       General ADL Comments: pt demonstrating LB,  standing grooming ADL, room and hallway level mobility without AD at distant supervision level. pt reports feeilng at his baseline with ADL/mobility tasks. educated both pt/pt's spouse on general safety education when initially completing ADL/mobility tasks after return home with both verbalizing understanding.     Vision Baseline Vision/History: Wears glasses Wears Glasses: At all times Patient Visual Report: No change from baseline Vision Assessment?: No apparent visual deficits Additional Comments: none noted     Perception     Praxis      Pertinent Vitals/Pain Pain Assessment: Faces Faces Pain Scale: No hurt Pain Intervention(s): Monitored during session     Hand Dominance     Extremity/Trunk Assessment Upper Extremity Assessment Upper Extremity Assessment: Overall WFL for tasks assessed   Lower Extremity Assessment Lower Extremity Assessment: Defer to PT evaluation;Overall [facility] for tasks assessed   Cervical / Trunk Assessment Cervical / Trunk Assessment: Normal   Communication Communication Communication: No difficulties   Cognition Arousal/Alertness: Awake/alert Behavior During Therapy: WFL for tasks assessed/performed Overall Cognitive Status: Within Functional Limits for tasks assessed  General Comments       Exercises     Shoulder Instructions      Home Living Family/patient expects to be discharged to:: Private residence Living Arrangements: Spouse/significant other;Children Available Help at Discharge: Family;Available 24 hours/day Type of Home: House Home Access: Stairs to enter [employer] of Steps: 2   Home Layout: Two level;Able to live on main level with bedroom/bathroom Alternate Level Stairs-Number of Steps: flight (steep)   Bathroom Shower/Tub: Walk-in shower;Tub/shower unit   Bathroom Toilet: Standard     Home Equipment: None          Prior Functioning/Environment  Level of Independence: Independent        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) Problem List: Decreased activity tolerance;Other (comment) (s/p tumor resection)      OT Treatment/Interventions:      OT Goals(Current goals can be found in the care plan section) Acute Rehab OT Goals Patient Stated Goal: home today OT Goal Formulation: All assessment and education complete, DC therapy  OT Frequency:     Barriers to D/C:            Co-evaluation PT/OT/SLP Co-Evaluation/Treatment: Yes Reason for Co-Treatment: For patient/therapist safety;To address functional/ADL transfers (pt with fall last night)   OT goals addressed during session: ADL's and self-care      AM-PAC OT "6 Clicks" Daily Activity     Outcome Measure Help from another person eating meals?: None Help from another person taking care of personal grooming?: None Help from another person toileting, which includes using toliet, bedpan, or urinal?: None Help from another person bathing (including washing, rinsing, drying)?: None Help from another person to put on and taking off regular upper body clothing?: None Help from another person to put on and taking off regular lower body clothing?: None 6 Click Score: 24   End of Session Nurse Communication: Mobility status  Activity Tolerance: Patient tolerated treatment well Patient left: in chair;with call bell/phone within reach;with family/visitor present  OT Visit Diagnosis: Other symptoms and signs involving the nervous system (R29.898);Other abnormalities of gait and mobility (R26.89)                Time: [id number] OT Time Calculation (min): 16 min Charges:  OT General Charges $OT Visit: 1 Visit OT Evaluation $OT Eval Moderate Complexity: [place], OT Acute Rehabilitation Services Pager [phone] Office [place] 12/08/2020, 3:14 PM

## 2020-12-08 NOTE — Progress Notes (Signed)
Neurosurgery Service Progress Note  Subjective: No acute events overnight. Denies any changes in speech / neurologic function,  headache minimal   Objective: Vitals:   12/08/20 0500 12/08/20 0600 12/08/20 0700 12/08/20 0800  BP: (!) 145/104 (!) 144/94 (!) 149/96 (!) 147/104  Pulse: 83 79 82 89  Resp: 15 14 10 11   Temp:      TempSrc:      SpO2: 93% 92% 91% 93%  Weight:      Height:        Physical Exam: AOx3, PERRL, EOMI, FS, TM, Speech fluent with normal content Strength 5/5 x4, SILTx4, no drift  Assessment & Plan: 39 y.o. man s/p awake crani for resection of hemorrhagic mass, recovering well. MRI with gross total resection, post-op changes with air/blood in the resection cavity.  -will have PT eval today, if he'd like to go home then discharge home this afternoon. If he's still unsteady when ambulating, will keep another night inpatient -ADAT -SCDs/TEDs, SQH POD2  Francis Flores  12/08/20 8:22 AM

## 2020-12-08 NOTE — Discharge Summary (Signed)
Discharge Summary  Date of Admission: 12/07/2020  Date of Discharge: 12/08/20  Attending Physician: Emelda Brothers, MD  Hospital Course: Patient was admitted following an uncomplicated left awake craniotomy for resection of a hematoma. He was recovered in PACU and transferred to 4N. His hospital course was uncomplicated and the patient was discharged home on 12/22. He will follow up in clinic with me in 2 weeks. He was given a decadron taper in his discharge instructions as well. Final path on the hematoma was pending at the time of discharge.   Neurologic exam at discharge:  AOx3, PERRL, EOMI, FS, TM Speech fluent with normal content, strength 5/5 x4, SILTx4, no drift  Discharge diagnosis: Intracerebral hematoma  Judith Part, MD 12/08/20 1:39 PM

## 2020-12-09 ENCOUNTER — Encounter (HOSPITAL_COMMUNITY): Payer: Self-pay | Admitting: Neurological Surgery

## 2020-12-09 MED FILL — Heparin Sodium (Porcine) Inj 1000 Unit/ML: INTRAMUSCULAR | Qty: 30 | Status: AC

## 2020-12-09 MED FILL — Sodium Chloride IV Soln 0.9%: INTRAVENOUS | Qty: 2000 | Status: AC

## 2020-12-13 NOTE — Telephone Encounter (Signed)
Received [place] [employer] forms, placed on MD desk for completion, signature.

## 2020-12-13 NOTE — Telephone Encounter (Signed)
Reasonable accommodations forms completed, signed and sent to medical records for processing.

## 2020-12-15 ENCOUNTER — Telehealth: Payer: Self-pay

## 2020-12-15 NOTE — Telephone Encounter (Signed)
Spoke with patient on the phone this morning, he would like a call back to discuss possibly getting the COVID booster shot. Number on chart is a good call back number.

## 2020-12-15 NOTE — Telephone Encounter (Signed)
Paperwork faxed and confirmed 12/15/20 at 9:02. Spoke with patient and advised him of same, a copy was left a check in for him to pick up.

## 2020-12-15 NOTE — Telephone Encounter (Signed)
Called patient and informed him Dr Danae Orleans is out of offcie until Monday. Advised he discuss Covid booster with PCP. Advised hm that Dr Marjory Lies did recommend 1st Covid vaccines to his patients. He stated he will contact PCP and also wait for Dr Richrd Humbles return, verbalized understanding, appreciation.

## 2020-12-20 NOTE — Telephone Encounter (Signed)
Called patient and advised per Dr Marjory Lies that he wait for upcoming appointment with Dr Marcia Brash on 12/23/19 re: Covid booster. Patient is expecting to receive pathology report at that time. Patient verbalized understanding, appreciation.

## 2020-12-23 ENCOUNTER — Other Ambulatory Visit: Payer: Self-pay | Admitting: Radiation Therapy

## 2020-12-27 ENCOUNTER — Inpatient Hospital Stay: Payer: 59

## 2020-12-27 LAB — SURGICAL PATHOLOGY

## 2021-01-10 ENCOUNTER — Inpatient Hospital Stay: Payer: 59 | Attending: Internal Medicine

## 2021-02-07 ENCOUNTER — Other Ambulatory Visit: Payer: Self-pay | Admitting: Family Medicine

## 2021-02-07 DIAGNOSIS — R7401 Elevation of levels of liver transaminase levels: Secondary | ICD-10-CM

## 2021-03-01 ENCOUNTER — Ambulatory Visit
Admission: RE | Admit: 2021-03-01 | Discharge: 2021-03-01 | Disposition: A | Discharge status: Home / Self Care | Payer: 59 | Source: Ambulatory Visit | Attending: Family Medicine | Admitting: Family Medicine

## 2021-03-01 ENCOUNTER — Other Ambulatory Visit: Payer: Self-pay

## 2021-03-01 DIAGNOSIS — R7401 Elevation of levels of liver transaminase levels: Secondary | ICD-10-CM

## 2021-04-05 ENCOUNTER — Encounter (HOSPITAL_COMMUNITY): Payer: Self-pay

## 2021-04-18 ENCOUNTER — Encounter: Payer: Self-pay | Admitting: Diagnostic Neuroimaging

## 2021-04-18 ENCOUNTER — Other Ambulatory Visit: Payer: Self-pay

## 2021-04-18 ENCOUNTER — Ambulatory Visit (INDEPENDENT_AMBULATORY_CARE_PROVIDER_SITE_OTHER): Payer: 59 | Admitting: Diagnostic Neuroimaging

## 2021-04-18 VITALS — BP 141/93 | HR 89 | Ht 69.0 in | Wt 266.0 lb

## 2021-04-18 DIAGNOSIS — R569 Unspecified convulsions: Secondary | ICD-10-CM | POA: Diagnosis not present

## 2021-04-18 DIAGNOSIS — Q283 Other malformations of cerebral vessels: Secondary | ICD-10-CM | POA: Diagnosis not present

## 2021-04-18 MED ORDER — LEVETIRACETAM 500 MG PO TABS: 500.0000 mg | ORAL_TABLET | Freq: Two times a day (BID) | ORAL | 4 refills | Status: DC

## 2021-04-18 NOTE — Progress Notes (Signed)
[facility]  PATIENT: Francis Flores DOB: [birth year]  REFERRING CLINICIAN: Koirala, Dibas, MD HISTORY FROM: patient  REASON FOR VISIT: follow up   HISTORICAL  CHIEF COMPLAINT:  Chief Complaint  Patient presents with  . Follow-up    Rm 7 alone here for 6 moth f/u- denied any seizure events- overall doing no concerns.     HISTORY OF PRESENT ILLNESS:   UPDATE (04/18/21, VRP): Since last visit, doing well. Had left temporal lobe resection of mass (ended up as cavernous malformation). Doing well. No alleviating or aggravating factors. Tolerating levetiracetam.    PRIOR HPI: 40 year old male here for evaluation of seizure.  10/08/2020 at 2 in the morning patient was asleep, and woke up wife because he could grab her arm.  She woke up and then found that he was unresponsive, with generalized tonic-clonic seizure.  His body was shaking but arms were not moving.  Seizures lasted for about 1 to 2 minutes.  Patient bit his tongue.  Afterwards he went to a deep sleep and started to snore.  She called 911 and paramedics arrived.  Vital signs showed blood pressure 146/94, pulse 118, blood glucose 94, temperature 97.1, EKG strip showed sinus tachycardia.  Within about 1 hour he started to wake up and was able to answer questions.  Patient has had some headaches and muscle soreness since the seizure.  They followed up with walk-in urgent care clinic the next day and had lab testing which were unremarkable.  Patient referred here for further evaluation.  In the few days leading up to this event patient had been having some stomach pain issues and decreased sleep.  No prior or recent accidents injuries or traumas.  No history of meningitis or encephalitis.  No family history of seizure.  No similar events in his past.  Patient does not drink caffeine or alcohol.    REVIEW OF SYSTEMS: Full 14 system review of systems performed and negative with exception of: As per  HPI.  ALLERGIES: No Known Allergies  HOME MEDICATIONS: Outpatient Medications Prior to Visit  Medication Sig Dispense Refill  . amLODipine (NORVASC) 5 MG tablet Take 1 tablet by mouth daily.    Marland Kitchen levETIRAcetam (KEPPRA) 500 MG tablet Take 1 tablet (500 mg total) by mouth 2 (two) times daily. 60 tablet 12  . dexamethasone (DECADRON) 4 MG tablet Take 1 tablet (4 mg total) by mouth 2 (two) times daily. 60 tablet 1  . HYDROcodone-acetaminophen (NORCO/VICODIN) 5-325 MG tablet Take 1 tablet by mouth every 4 (four) hours as needed for moderate pain. 30 tablet 0   No facility-administered medications prior to visit.    PAST MEDICAL HISTORY: Past Medical History:  Diagnosis Date  . Seizure-like activity ([place])     PAST SURGICAL HISTORY: Past Surgical History:  Procedure Laterality Date  . APPLICATION OF CRANIAL NAVIGATION N/A 12/07/2020   Procedure: APPLICATION OF CRANIAL NAVIGATION;  Surgeon: Judith Part, MD;  Location: [place];  Service: Neurosurgery;  Laterality: N/A;  . arm fracture    . CRANIOTOMY Left 12/07/2020   Procedure: Left awake craniotomy for tumor resection;  Surgeon: Judith Part, MD;  Location: [place];  Service: Neurosurgery;  Laterality: Left;    FAMILY HISTORY: History reviewed. No pertinent family history.  SOCIAL HISTORY: Social History   Socioeconomic History  . Marital status: Married    Spouse name: Not on file  . Number of children: 0  . Years of education: Not on file  . Highest  education level: Not on file  Occupational History    Comment: engineer  Tobacco Use  . Smoking status: Never Smoker  . Smokeless tobacco: Never Used  Vaping Use  . Vaping Use: Never used  Substance and Sexual Activity  . Alcohol use: Never  . Drug use: Not Currently  . Sexual activity: Not on file  Other Topics Concern  . Not on file  Social History Narrative  . Not on file   Social Determinants of Health   Financial Resource Strain: Not on file  Food  Insecurity: Not on file  Transportation Needs: Not on file  Physical Activity: Not on file  Stress: Not on file  Social Connections: Not on file  Intimate Partner Violence: Not on file     PHYSICAL EXAM  GENERAL EXAM/CONSTITUTIONAL: Vitals:  Vitals:   04/18/21 1508  BP: (!) 141/93  Pulse: 89  Weight: 266 lb (120.7 kg)  Height: 5\' 9"  (1.753 m)   Body mass index is 39.28 kg/m. Wt Readings from Last 3 Encounters:  04/18/21 266 lb (120.7 kg)  12/07/20 262 lb 5.6 oz (119 kg)  12/02/20 262 lb 6.4 oz (119 kg)    Patient is in no distress; well developed, nourished and groomed; neck is supple  CARDIOVASCULAR:  Examination of carotid arteries is normal; no carotid bruits  Regular rate and rhythm, no murmurs  Examination of peripheral vascular system by observation and palpation is normal  EYES:  Ophthalmoscopic exam of optic discs and posterior segments is normal; no papilledema or hemorrhages No exam data present  MUSCULOSKELETAL:  Gait, strength, tone, movements noted in Neurologic exam below  NEUROLOGIC: MENTAL STATUS:  No flowsheet data found.  awake, alert, oriented to person, place and time  recent and remote memory intact  normal attention and concentration  language fluent, comprehension intact, naming intact  fund of knowledge appropriate  CRANIAL NERVE:   2nd - no papilledema on fundoscopic exam  2nd, 3rd, 4th, 6th - pupils equal and reactive to light, visual fields full to confrontation, extraocular muscles intact, no nystagmus  5th - facial sensation symmetric  7th - facial strength symmetric  8th - hearing intact  9th - palate elevates symmetrically, uvula midline  11th - shoulder shrug symmetric  12th - tongue protrusion midline  MOTOR:   normal bulk and tone, full strength in the BUE, BLE  SENSORY:   normal and symmetric to light touch, temperature, vibration  COORDINATION:   finger-nose-finger, fine finger movements  normal  REFLEXES:   deep tendon reflexes trace and symmetric  GAIT/STATION:   narrow based gait    DIAGNOSTIC DATA (LABS, IMAGING, TESTING) - I reviewed patient records, labs, notes, testing and imaging myself where available.  Lab Results  Component Value Date   WBC 24.8 (H) 12/07/2020   HGB 14.9 12/07/2020   HCT 46.6 12/07/2020   MCV 93.6 12/07/2020   PLT 225 12/07/2020      Component Value Date/Time   NA 131 (L) 12/07/2020 1112   K 4.8 12/07/2020 1112   CL 97 (L) 12/01/2020 0833   CO2 25 12/01/2020 0833   GLUCOSE 99 12/01/2020 0833   BUN 26 (H) 12/01/2020 0833   CREATININE 0.82 12/07/2020 1448   CALCIUM 9.0 12/01/2020 0833   GFRNONAA >60 12/07/2020 1448   No results found for: CHOL, HDL, LDLCALC, LDLDIRECT, TRIG, CHOLHDL No results found for: HGBA1C No results found for: VITAMINB12 No results found for: TSH   10/08/20 CBC, CMP normal except slightly elevated  LFTs    ASSESSMENT AND PLAN  40 y.o. year old male here with:  Dx:  1. Cavernous malformation   2. 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 levETIRAcetam (KEPPRA) 500 MG tablet    Sig: Take 1 tablet (500 mg total) by mouth 2 (two) times daily.    Dispense:  180 tablet    Refill:  4   Return in about 1 year (around 04/18/2022).    Penni Bombard, MD 03/24/961, 8:36 PM Certified in Neurology, Neurophysiology and Neuroimaging  [facility] [address], [place], [place] [address] [phone]

## 2021-04-20 ENCOUNTER — Telehealth: Payer: Self-pay | Admitting: *Deleted

## 2021-04-20 NOTE — Telephone Encounter (Signed)
Received request for medical records, reasonable accommodations form to be filled out. Completed, signed and sent back to medical records for processing.

## 2021-04-20 NOTE — Telephone Encounter (Signed)
Pt Kleinfelder form ready for p/u @ the front desk.

## 2022-04-17 ENCOUNTER — Encounter: Payer: Self-pay | Admitting: Diagnostic Neuroimaging

## 2022-04-17 ENCOUNTER — Ambulatory Visit (INDEPENDENT_AMBULATORY_CARE_PROVIDER_SITE_OTHER): Payer: 59 | Admitting: Diagnostic Neuroimaging

## 2022-04-17 VITALS — BP 134/90 | HR 79 | Ht 69.0 in | Wt 272.0 lb

## 2022-04-17 DIAGNOSIS — Q283 Other malformations of cerebral vessels: Secondary | ICD-10-CM

## 2022-04-17 DIAGNOSIS — R569 Unspecified convulsions: Secondary | ICD-10-CM | POA: Diagnosis not present

## 2022-04-17 MED ORDER — LEVETIRACETAM 500 MG PO TABS
500.0000 mg | ORAL_TABLET | Freq: Two times a day (BID) | ORAL | 4 refills | Status: DC
Start: 2022-04-17 — End: 2023-04-23

## 2022-04-17 NOTE — Progress Notes (Signed)
? ?[facility] ? ?PATIENT: Francis Flores ?DOB: 07/07/1981 ? ?REFERRING CLINICIAN: Koirala, Dibas, MD ?HISTORY FROM: patient  ?REASON FOR VISIT: follow up ? ? ?HISTORICAL ? ?CHIEF COMPLAINT:  ?Chief Complaint  ?Patient presents with  ? Seizures  ?  Rm 7 One Year FU  "no seizures"   ? ? ?HISTORY OF PRESENT ILLNESS:  ? ?UPDATE (04/17/22, VRP): Since last visit, doing well. Symptoms are stable. LEV '500mg'$  twice a day stable. ? ?UPDATE (04/18/21, VRP): Since last visit, doing well. Had left temporal lobe resection of mass (ended up as cavernous malformation). Doing well. No alleviating or aggravating factors. Tolerating levetiracetam.   ? ?PRIOR HPI: 41 year old male here for evaluation of seizure. ? ?10/08/2020 at 2 in the morning patient was asleep, and woke up wife because he could grab her arm.  She woke up and then found that he was unresponsive, with generalized tonic-clonic seizure.  His body was shaking but arms were not moving.  Seizures lasted for about 1 to 2 minutes.  Patient bit his tongue.  Afterwards he went to a deep sleep and started to snore.  She called 911 and paramedics arrived.  Vital signs showed blood pressure 146/94, pulse 118, blood glucose 94, temperature 97.1, EKG strip showed sinus tachycardia.  Within about 1 hour he started to wake up and was able to answer questions.  Patient has had some headaches and muscle soreness since the seizure. ? ?They followed up with walk-in urgent care clinic the next day and had lab testing which were unremarkable.  Patient referred here for further evaluation. ? ?In the few days leading up to this event patient had been having some stomach pain issues and decreased sleep.  No prior or recent accidents injuries or traumas.  No history of meningitis or encephalitis.  No family history of seizure.  No similar events in his past.  Patient does not drink caffeine or alcohol. ? ? ?REVIEW OF SYSTEMS: Full 14 system review of systems performed and  negative with exception of: As per HPI. ? ?ALLERGIES: ?No Known Allergies ? ?HOME MEDICATIONS: ?Outpatient Medications Prior to Visit  ?Medication Sig Dispense Refill  ? amLODipine (NORVASC) 5 MG tablet Take 1 tablet by mouth daily.    ? levETIRAcetam (KEPPRA) 500 MG tablet Take 1 tablet (500 mg total) by mouth 2 (two) times daily. 180 tablet 4  ? ?No facility-administered medications prior to visit.  ? ? ?PAST MEDICAL HISTORY: ?Past Medical History:  ?Diagnosis Date  ? Seizure-like activity ([place])   ? Seizures ([place])   ? ? ?PAST SURGICAL HISTORY: ?Past Surgical History:  ?Procedure Laterality Date  ? APPLICATION OF CRANIAL NAVIGATION N/A 12/07/2020  ? Procedure: APPLICATION OF CRANIAL NAVIGATION;  Surgeon: Judith Part, MD;  Location: [place];  Service: Neurosurgery;  Laterality: N/A;  ? arm fracture    ? CRANIOTOMY Left 12/07/2020  ? Procedure: Left awake craniotomy for tumor resection;  Surgeon: Judith Part, MD;  Location: [place];  Service: Neurosurgery;  Laterality: Left;  ? ? ?FAMILY HISTORY: ?No family history on file. ? ?SOCIAL HISTORY: ?Social History  ? ?Socioeconomic History  ? Marital status: Married  ?  Spouse name: Not on file  ? Number of children: 2  ? Years of education: Not on file  ? Highest education level: Not on file  ?Occupational History  ?  Comment: Chief Financial Officer  ?Tobacco Use  ? Smoking status: Never  ? Smokeless tobacco: Never  ?Vaping Use  ? Vaping Use:  Never used  ?Substance and Sexual Activity  ? Alcohol use: Never  ? Drug use: Not Currently  ? Sexual activity: Not on file  ?Other Topics Concern  ? Not on file  ?Social History Narrative  ? Lives with wife, children  ? ?Social Determinants of Health  ? ?Financial Resource Strain: Not on file  ?Food Insecurity: Not on file  ?Transportation Needs: Not on file  ?Physical Activity: Not on file  ?Stress: Not on file  ?Social Connections: Not on file  ?Intimate Partner Violence: Not on file  ? ? ? ?PHYSICAL EXAM ? ?GENERAL  EXAM/CONSTITUTIONAL: ?Vitals:  ?Vitals:  ? 04/17/22 1514  ?BP: 134/90  ?Pulse: 79  ?Weight: 272 lb (123.4 kg)  ?Height: '5\' 9"'$  (1.753 m)  ? ?Body mass index is 40.17 kg/m?. ?Wt Readings from Last 3 Encounters:  ?04/17/22 272 lb (123.4 kg)  ?04/18/21 266 lb (120.7 kg)  ?12/07/20 262 lb 5.6 oz (119 kg)  ? ?Patient is in no distress; well developed, nourished and groomed; neck is supple ? ?CARDIOVASCULAR: ?Examination of carotid arteries is normal; no carotid bruits ?Regular rate and rhythm, no murmurs ?Examination of peripheral vascular system by observation and palpation is normal ? ?EYES: ?Ophthalmoscopic exam of optic discs and posterior segments is normal; no papilledema or hemorrhages ?No results found. ? ?MUSCULOSKELETAL: ?Gait, strength, tone, movements noted in Neurologic exam below ? ?NEUROLOGIC: ?MENTAL STATUS:  ?   ? View : No data to display.  ?  ?  ?  ? ?awake, alert, oriented to person, place and time ?recent and remote memory intact ?normal attention and concentration ?language fluent, comprehension intact, naming intact ?fund of knowledge appropriate ? ?CRANIAL NERVE:  ?2nd - no papilledema on fundoscopic exam ?2nd, 3rd, 4th, 6th - pupils equal and reactive to light, visual fields full to confrontation, extraocular muscles intact, no nystagmus ?5th - facial sensation symmetric ?7th - facial strength symmetric ?8th - hearing intact ?9th - palate elevates symmetrically, uvula midline ?11th - shoulder shrug symmetric ?12th - tongue protrusion midline ? ?MOTOR:  ?normal bulk and tone, full strength in the BUE, BLE ? ?SENSORY:  ?normal and symmetric to light touch, temperature, vibration ? ?COORDINATION:  ?finger-nose-finger, fine finger movements normal ? ?REFLEXES:  ?deep tendon reflexes trace and symmetric ? ?GAIT/STATION:  ?narrow based gait ? ? ? ?DIAGNOSTIC DATA (LABS, IMAGING, TESTING) ?- I reviewed patient records, labs, notes, testing and imaging myself where available. ? ?Lab Results  ?Component  Value Date  ? WBC 24.8 (H) 12/07/2020  ? HGB 14.9 12/07/2020  ? HCT 46.6 12/07/2020  ? MCV 93.6 12/07/2020  ? PLT 225 12/07/2020  ? ?   ?Component Value Date/Time  ? NA 131 (L) 12/07/2020 1112  ? K 4.8 12/07/2020 1112  ? CL 97 (L) 12/01/2020 8657  ? CO2 25 12/01/2020 0833  ? GLUCOSE 99 12/01/2020 0833  ? BUN 26 (H) 12/01/2020 8469  ? CREATININE 0.82 12/07/2020 1448  ? CALCIUM 9.0 12/01/2020 0833  ? GFRNONAA >60 12/07/2020 1448  ? ?No results found for: CHOL, HDL, LDLCALC, LDLDIRECT, TRIG, CHOLHDL ?No results found for: HGBA1C ?No results found for: VITAMINB12 ?No results found for: TSH ? ? ?10/08/20 CBC, CMP normal except slightly elevated LFTs ? ? ? ?ASSESSMENT AND PLAN ? ?41 y.o. year old male here with: ? ?Dx: ? ?1. Cavernous malformation   ?2. New onset seizure ([place])   ? ? ? ?PLAN: ? 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 ?Meds ordered this encounter  ?Medications  ? levETIRAcetam (KEPPRA) 500 MG tablet  ?  Sig: Take 1 tablet (500 mg total) by mouth 2 (two) times daily.  ?  Dispense:  180 tablet  ?  Refill:  4  ? ?Return in about 1 year (around 04/18/2023) for MyChart visit (15 min). ? ? ? ?Penni Bombard, MD 12/23/1094, 0:45 PM ?Certified in Neurology, Neurophysiology and Neuroimaging ? ?[facility] ?[address], [address] ?[place], [place] [address] ?[phone] ? ?

## 2022-04-19 ENCOUNTER — Ambulatory Visit: Payer: 59 | Admitting: Diagnostic Neuroimaging

## 2022-05-04 ENCOUNTER — Other Ambulatory Visit: Payer: Self-pay | Admitting: Diagnostic Neuroimaging

## 2022-09-30 IMAGING — US US ABDOMEN LIMITED RUQ/ASCITES
1 series · 14 of 25 positions shown · non-contrast
Comparison: None.

CLINICAL DATA: Transaminitis

EXAM:
ULTRASOUND ABDOMEN LIMITED RIGHT UPPER QUADRANT

[Series 1: us abdomen limited ruq/ascites · 0.22mm/px · 14 of 69 slices shown]
[im 1/69]
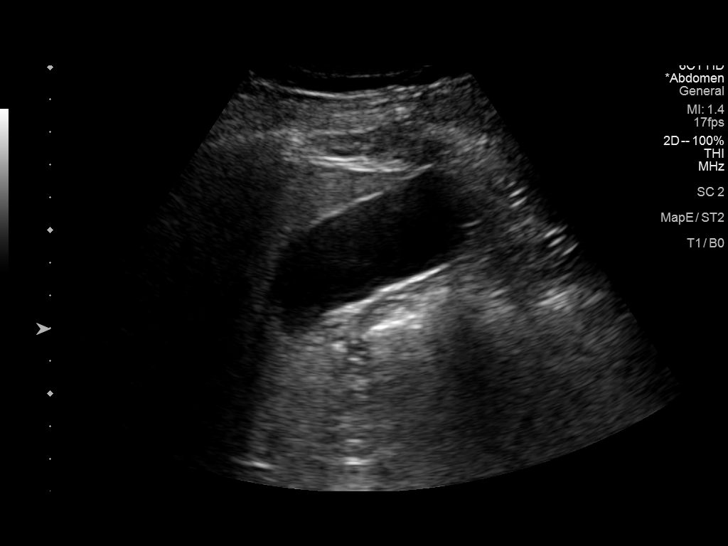
[im 6/69]
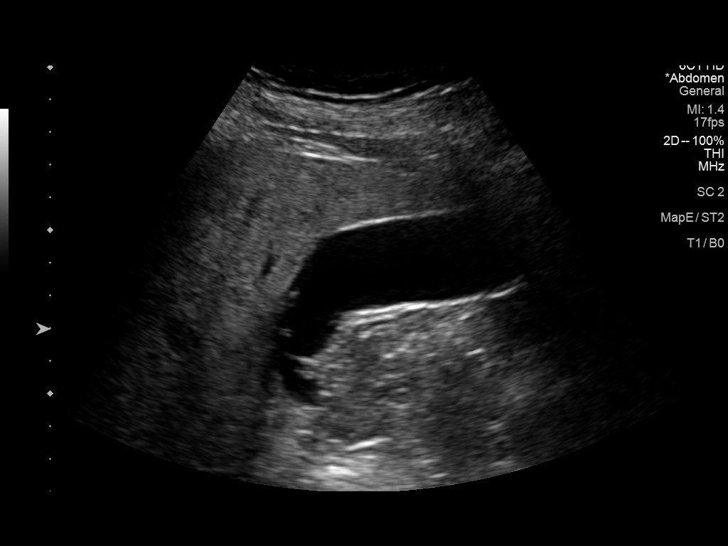
[im 12/69]
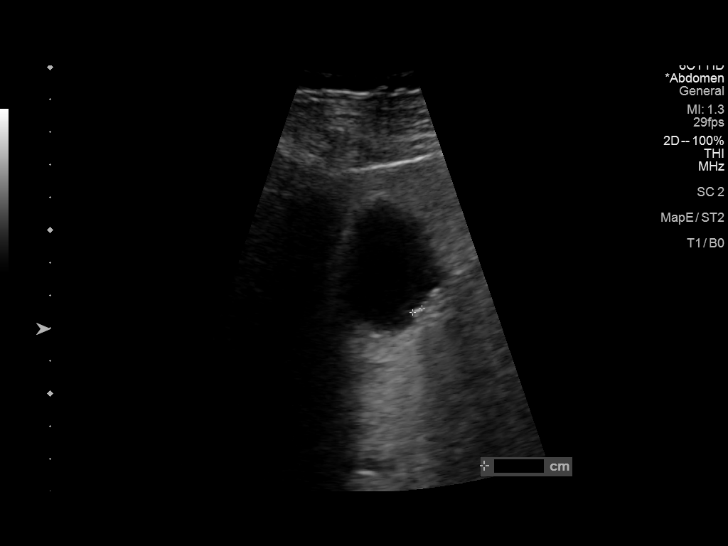
[im 18/69]
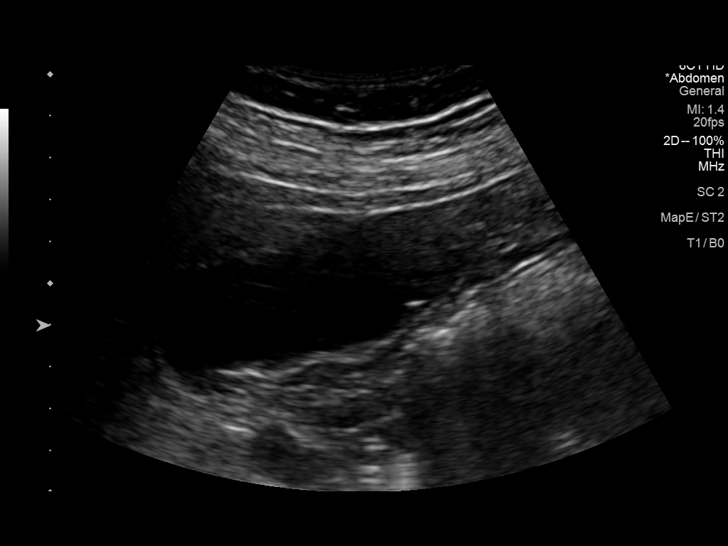
[im 23/69]
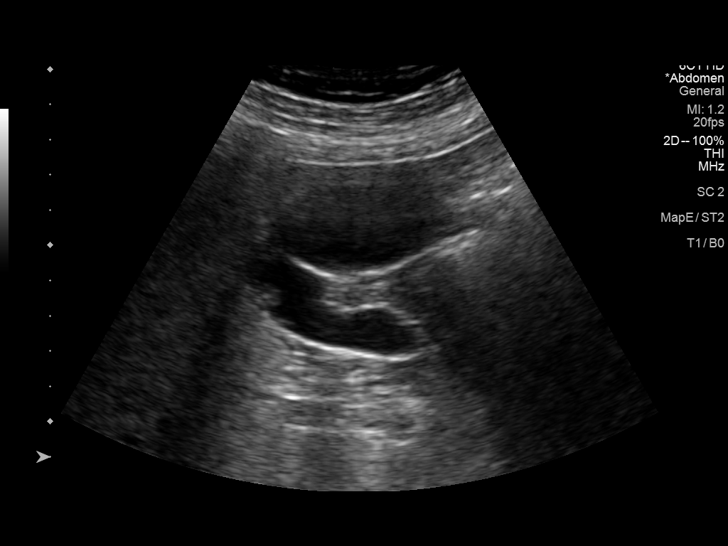
[im 26/69]
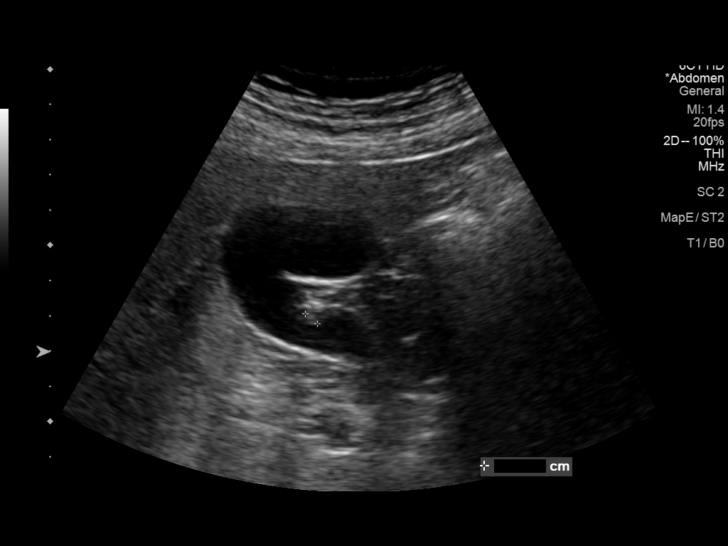
[im 32/69]
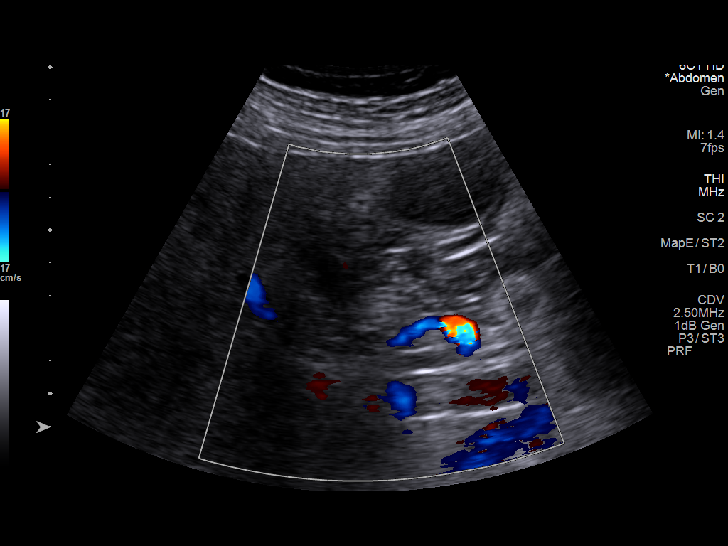
[im 37/69]
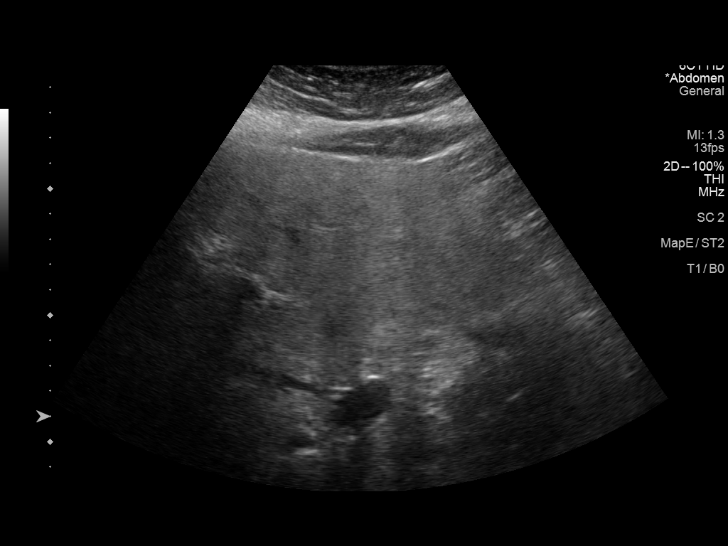
[im 43/69]
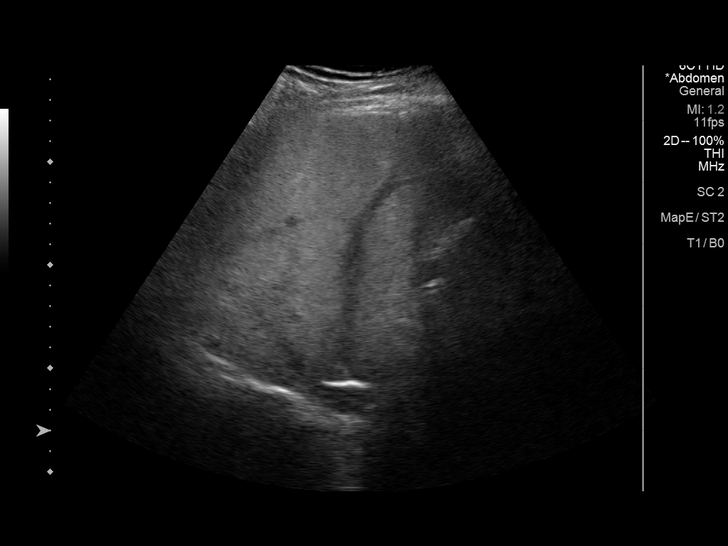
[im 46/69]
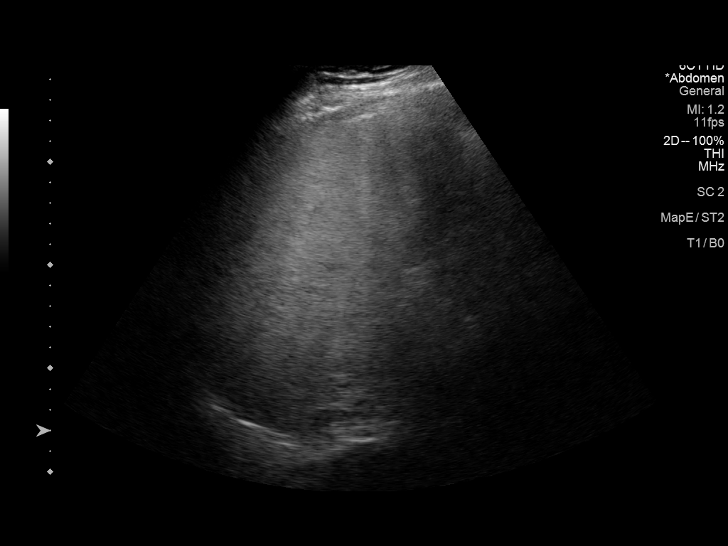
[im 52/69]
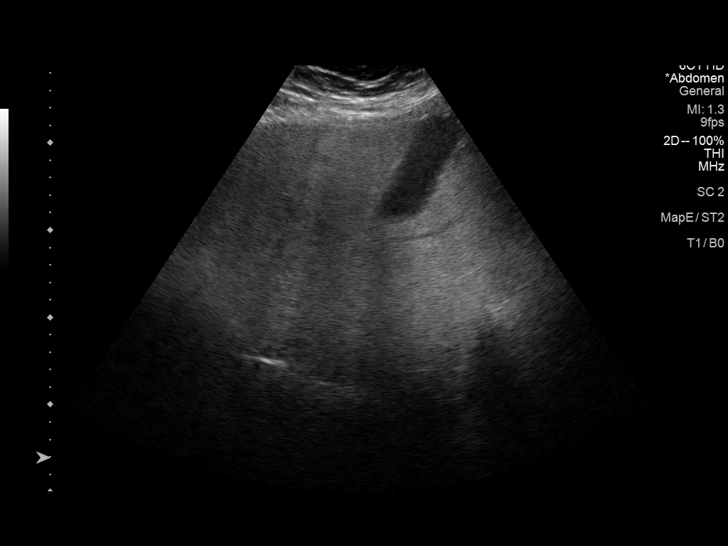
[im 57/69]
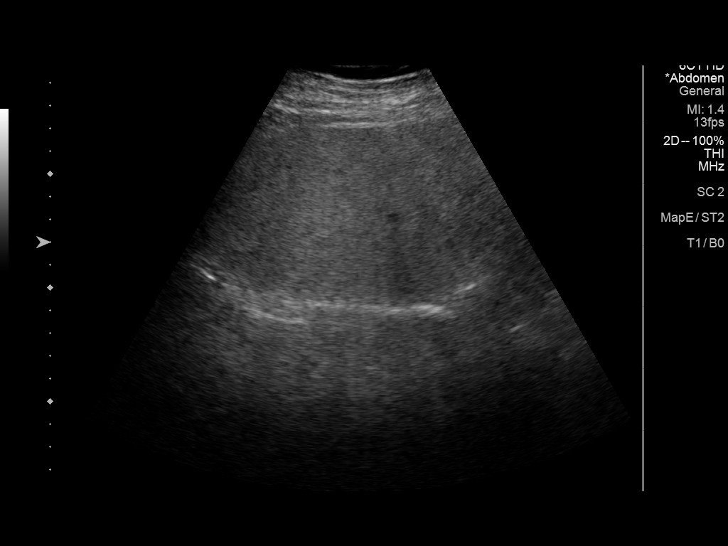
[im 63/69]
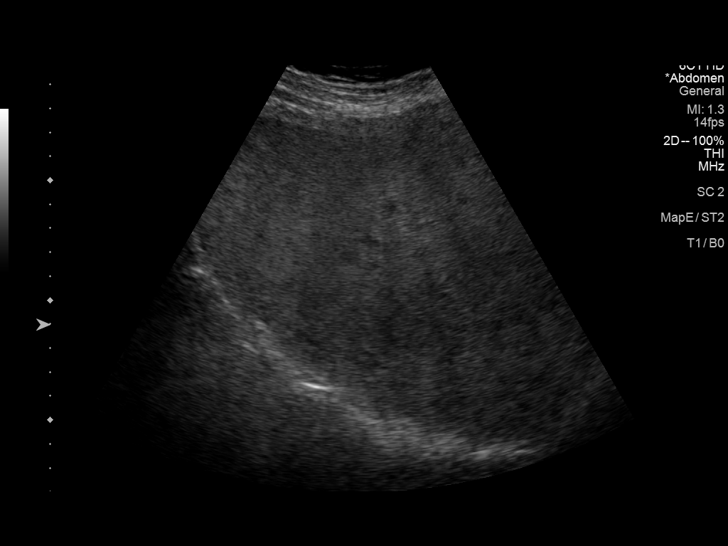
[im 69/69]
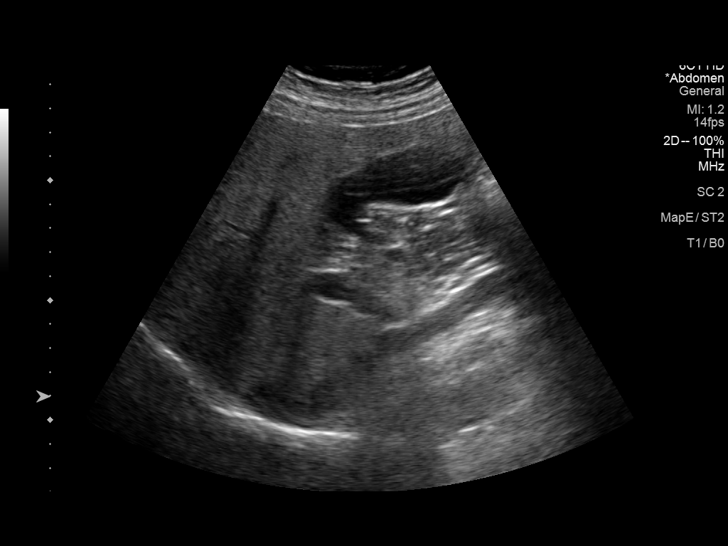

[14 of 25 positions shown; findings below may reference images not displayed]

FINDINGS: Gallbladder:

Small (up to 0.4 cm) mobile gallstones with minimal shadowing. There
also some small nonmobile filling defects up to 0.5 cm compatible
with polyps. No gallbladder wall thickening. Sonographic Murphy's
sign absent.

Common bile duct:

Diameter: 0.4 cm

Liver:

No focal lesion identified. Coarse echogenic liver with poor sonic
penetration compatible with diffuse hepatic steatosis. Portal vein
is patent on color Doppler imaging with normal direction of blood
flow towards the liver.

Other: None.
IMPRESSION: 1. Coarse echogenic liver with poor sonic penetration compatible
with diffuse hepatic steatosis. No focal liver lesions are observed.
2. Small gallstones.
3. Gallbladder polyps measuring up to 0.5 cm in diameter. Based on
current ACR guidelines, no further evaluation specifically for these
polyps is considered necessary.

## 2023-04-23 ENCOUNTER — Encounter: Payer: Self-pay | Admitting: Diagnostic Neuroimaging

## 2023-04-23 ENCOUNTER — Telehealth (INDEPENDENT_AMBULATORY_CARE_PROVIDER_SITE_OTHER): Payer: BC Managed Care – PPO | Admitting: Diagnostic Neuroimaging

## 2023-04-23 DIAGNOSIS — Q283 Other malformations of cerebral vessels: Secondary | ICD-10-CM

## 2023-04-23 DIAGNOSIS — R569 Unspecified convulsions: Secondary | ICD-10-CM | POA: Diagnosis not present

## 2023-04-23 MED ORDER — LEVETIRACETAM 500 MG PO TABS: 500.0000 mg | ORAL_TABLET | Freq: Two times a day (BID) | ORAL | 4 refills | Status: DC

## 2023-04-23 NOTE — Progress Notes (Signed)
[facility]  PATIENT: Francis Flores DOB: January 18, 1981  REFERRING CLINICIAN: Koirala, Dibas, MD HISTORY FROM: patient  REASON FOR VISIT: follow up   HISTORICAL  CHIEF COMPLAINT:  Chief Complaint  Patient presents with   Seizures    Cavernous malformation follow up    HISTORY OF PRESENT ILLNESS:   UPDATE (04/23/23, VRP): Since last visit, doing well. Symptoms are stable. Tolerating LEV. No seizures.  UPDATE (04/17/22, VRP): Since last visit, doing well. Symptoms are stable. LEV 500mg  twice a day stable.  UPDATE (04/18/21, VRP): Since last visit, doing well. Had left temporal lobe resection of mass (ended up as cavernous malformation). Doing well. No alleviating or aggravating factors. Tolerating levetiracetam.    PRIOR HPI: 42 year old male here for evaluation of seizure.  10/08/2020 at 2 in the morning patient was asleep, and woke up wife because he could grab her arm.  She woke up and then found that he was unresponsive, with generalized tonic-clonic seizure.  His body was shaking but arms were not moving.  Seizures lasted for about 1 to 2 minutes.  Patient bit his tongue.  Afterwards he went to a deep sleep and started to snore.  She called 911 and paramedics arrived.  Vital signs showed blood pressure 146/94, pulse 118, blood glucose 94, temperature 97.1, EKG strip showed sinus tachycardia.  Within about 1 hour he started to wake up and was able to answer questions.  Patient has had some headaches and muscle soreness since the seizure.  They followed up with walk-in urgent care clinic the next day and had lab testing which were unremarkable.  Patient referred here for further evaluation.  In the few days leading up to this event patient had been having some stomach pain issues and decreased sleep.  No prior or recent accidents injuries or traumas.  No history of meningitis or encephalitis.  No family history of seizure.  No similar events in his past.  Patient does  not drink caffeine or alcohol.   REVIEW OF SYSTEMS: Full 14 system review of systems performed and negative with exception of: As per HPI.  ALLERGIES: No Known Allergies  HOME MEDICATIONS: Outpatient Medications Prior to Visit  Medication Sig Dispense Refill   amLODipine (NORVASC) 5 MG tablet Take 1 tablet by mouth daily.     levETIRAcetam (KEPPRA) 500 MG tablet Take 1 tablet (500 mg total) by mouth 2 (two) times daily. 180 tablet 4   No facility-administered medications prior to visit.    PAST MEDICAL HISTORY: Past Medical History:  Diagnosis Date   Seizure-like activity (HCC)    Seizures (HCC)     PAST SURGICAL HISTORY: Past Surgical History:  Procedure Laterality Date   APPLICATION OF CRANIAL NAVIGATION N/A 12/07/2020   Procedure: APPLICATION OF CRANIAL NAVIGATION;  Surgeon: Jadene Pierini, MD;  Location: [facility] OR;  Service: Neurosurgery;  Laterality: N/A;   arm fracture     CRANIOTOMY Left 12/07/2020   Procedure: Left awake craniotomy for tumor resection;  Surgeon: Jadene Pierini, MD;  Location: [facility] OR;  Service: Neurosurgery;  Laterality: Left;    FAMILY HISTORY: No family history on file.  SOCIAL HISTORY: Social History   Socioeconomic History   Marital status: Married    Spouse name: Not on file   Number of children: 2   Years of education: Not on file   Highest education level: Not on file  Occupational History    Comment: Art gallery manager  Tobacco Use   Smoking status: Never  Smokeless tobacco: Never  Vaping Use   Vaping Use: Never used  Substance and Sexual Activity   Alcohol use: Never   Drug use: Not Currently   Sexual activity: Not on file  Other Topics Concern   Not on file  Social History Narrative   Lives with wife, children   Social Determinants of Health   Financial Resource Strain: Not on file  Food Insecurity: Not on file  Transportation Needs: Not on file  Physical Activity: Not on file  Stress: Not on file  Social Connections:  Not on file  Intimate Partner Violence: Not on file     PHYSICAL EXAM  GENERAL EXAM/CONSTITUTIONAL: Vitals:  There were no vitals filed for this visit.  There is no height or weight on file to calculate BMI. Wt Readings from Last 3 Encounters:  04/17/22 272 lb (123.4 kg)  04/18/21 266 lb (120.7 kg)  12/07/20 262 lb 5.6 oz (119 kg)   Patient is in no distress; well developed, nourished and groomed; neck is supple  CARDIOVASCULAR: Examination of carotid arteries is normal; no carotid bruits Regular rate and rhythm, no murmurs Examination of peripheral vascular system by observation and palpation is normal  EYES: Ophthalmoscopic exam of optic discs and posterior segments is normal; no papilledema or hemorrhages No results found.  MUSCULOSKELETAL: Gait, strength, tone, movements noted in Neurologic exam below  NEUROLOGIC: MENTAL STATUS:      No data to display         awake, alert, oriented to person, place and time recent and remote memory intact normal attention and concentration language fluent, comprehension intact, naming intact fund of knowledge appropriate  CRANIAL NERVE:  2nd - no papilledema on fundoscopic exam 2nd, 3rd, 4th, 6th - pupils equal and reactive to light, visual fields full to confrontation, extraocular muscles intact, no nystagmus 5th - facial sensation symmetric 7th - facial strength symmetric 8th - hearing intact 9th - palate elevates symmetrically, uvula midline 11th - shoulder shrug symmetric 12th - tongue protrusion midline  MOTOR:  normal bulk and tone, full strength in the BUE, BLE  SENSORY:  normal and symmetric to light touch, temperature, vibration  COORDINATION:  finger-nose-finger, fine finger movements normal  REFLEXES:  deep tendon reflexes trace and symmetric  GAIT/STATION:  narrow based gait    DIAGNOSTIC DATA (LABS, IMAGING, TESTING) - I reviewed patient records, labs, notes, testing and imaging myself where  available.  Lab Results  Component Value Date   WBC 24.8 (H) 12/07/2020   HGB 14.9 12/07/2020   HCT 46.6 12/07/2020   MCV 93.6 12/07/2020   PLT 225 12/07/2020      Component Value Date/Time   NA 131 (L) 12/07/2020 1112   K 4.8 12/07/2020 1112   CL 97 (L) 12/01/2020 0833   CO2 25 12/01/2020 0833   GLUCOSE 99 12/01/2020 0833   BUN 26 (H) 12/01/2020 0833   CREATININE 0.82 12/07/2020 1448   CALCIUM 9.0 12/01/2020 0833   GFRNONAA >60 12/07/2020 1448   No results found for: "CHOL", "HDL", "LDLCALC", "LDLDIRECT", "TRIG", "CHOLHDL" No results found for: "HGBA1C" No results found for: "VITAMINB12" No results found for: "TSH"   10/08/20 CBC, CMP normal except slightly elevated LFTs  10/29/20 MRI of the brain with and without contrast shows the following: 1.  There is a hemorrhage in the lateral left temporal lobe with intracellular and extracellular methemoglobin consistent with an event occurring between 3 and 28 days ago, most likely in the middle of the range.  There is mild mass-effect associated with this focus.  Although there is no enhancement noted, a pathologic etiology cannot be ruled out.  Consider repeat imaging in the future to further evaluate. 2.  The rest of the brain was normal.  12/07/20 MRI brain  - Expected postoperative changes post gross total resection of left temporal hematoma/mass.    ASSESSMENT AND PLAN  42 y.o. year old male here with:  Dx:  1. New onset seizure (HCC)   2. Cavernous malformation     PLAN:  NEW ONSET SEIZURE (10/08/20; due to left temporal cavernous malformation, s/p resection 12/07/20) - doing well; continue levetiracetam 500mg  twice a day   Meds ordered this encounter  Medications   levETIRAcetam (KEPPRA) 500 MG tablet    Sig: Take 1 tablet (500 mg total) by mouth 2 (two) times daily.    Dispense:  180 tablet    Refill:  4   Return in about 1 year (around 04/22/2024) for MyChart visit (15 min).  Virtual Visit via Video  Note  I connected with Gwenith Spitz on 04/23/23 at  2:45 PM EDT by a video enabled telemedicine application and verified that I am speaking with the correct person using two identifiers.   I discussed the limitations of evaluation and management by telemedicine and the availability of in person appointments. The patient expressed understanding and agreed to proceed.  Patient is at home and I am at the office.   I spent 15 minutes of face-to-face and non-face-to-face time with patient.  This included previsit chart review, lab review, study review, order entry, electronic health record documentation, patient education.      Suanne Marker, MD 04/23/2023, 2:33 PM Certified in Neurology, Neurophysiology and Neuroimaging  [facility] [address], [address] [place], [place] [address] [phone]

## 2023-08-24 DIAGNOSIS — I10 Essential (primary) hypertension: Secondary | ICD-10-CM | POA: Diagnosis not present

## 2023-08-24 DIAGNOSIS — Z0000 Encounter for general adult medical examination without abnormal findings: Secondary | ICD-10-CM | POA: Diagnosis not present

## 2023-08-24 DIAGNOSIS — Z13220 Encounter for screening for lipoid disorders: Secondary | ICD-10-CM | POA: Diagnosis not present

## 2023-08-24 DIAGNOSIS — Z131 Encounter for screening for diabetes mellitus: Secondary | ICD-10-CM | POA: Diagnosis not present

## 2023-11-19 DIAGNOSIS — L814 Other melanin hyperpigmentation: Secondary | ICD-10-CM | POA: Diagnosis not present

## 2023-11-19 DIAGNOSIS — L918 Other hypertrophic disorders of the skin: Secondary | ICD-10-CM | POA: Diagnosis not present

## 2023-11-19 DIAGNOSIS — D225 Melanocytic nevi of trunk: Secondary | ICD-10-CM | POA: Diagnosis not present

## 2023-11-19 DIAGNOSIS — L821 Other seborrheic keratosis: Secondary | ICD-10-CM | POA: Diagnosis not present

## 2023-12-05 DIAGNOSIS — L538 Other specified erythematous conditions: Secondary | ICD-10-CM | POA: Diagnosis not present

## 2023-12-05 DIAGNOSIS — R208 Other disturbances of skin sensation: Secondary | ICD-10-CM | POA: Diagnosis not present

## 2023-12-05 DIAGNOSIS — L918 Other hypertrophic disorders of the skin: Secondary | ICD-10-CM | POA: Diagnosis not present

## 2023-12-05 DIAGNOSIS — L2989 Other pruritus: Secondary | ICD-10-CM | POA: Diagnosis not present

## 2024-04-27 ENCOUNTER — Other Ambulatory Visit: Payer: Self-pay | Admitting: Diagnostic Neuroimaging

## 2024-07-25 ENCOUNTER — Other Ambulatory Visit: Payer: Self-pay | Admitting: Diagnostic Neuroimaging

## 2024-08-26 ENCOUNTER — Telehealth: Payer: Self-pay | Admitting: Diagnostic Neuroimaging

## 2024-09-01 NOTE — Telephone Encounter (Signed)
 levETIRAcetam  (KEPPRA ) 500 MG tablet   [id number] - [place],  - [address] [facility] [address] Phone: [phone]  Fax: [phone]

## 2024-09-01 NOTE — Telephone Encounter (Signed)
 [facility] called to follow up on Pt refill request , Informed Pt need to call office to schedule appt  in order to receive medication .

## 2024-09-01 NOTE — Telephone Encounter (Signed)
 Cld Pt to discuss unable to refill medication due to Pt not having 1 year f/u appt after last VV on 04/23/2023. Pt voiced understanding and was able to schedule him with Dr. Margaret for Thurs, Dec 4 at 2:30pm. Informed of importance to keep appt. Pt voiced understanding. Also informed Pt will request refill for medication. Pt voiced thanks and thanks for the call to get him scheduled.

## 2024-09-15 DIAGNOSIS — K760 Fatty (change of) liver, not elsewhere classified: Secondary | ICD-10-CM | POA: Diagnosis not present

## 2024-09-15 DIAGNOSIS — K921 Melena: Secondary | ICD-10-CM | POA: Diagnosis not present

## 2024-09-15 DIAGNOSIS — I10 Essential (primary) hypertension: Secondary | ICD-10-CM | POA: Diagnosis not present

## 2024-09-15 DIAGNOSIS — Z0000 Encounter for general adult medical examination without abnormal findings: Secondary | ICD-10-CM | POA: Diagnosis not present

## 2024-10-23 ENCOUNTER — Telehealth: Payer: Self-pay | Admitting: *Deleted

## 2024-10-23 NOTE — Telephone Encounter (Signed)
 Procedure clearance signed and faxed to [facility] this afternoon

## 2024-10-24 DIAGNOSIS — D123 Benign neoplasm of transverse colon: Secondary | ICD-10-CM | POA: Diagnosis not present

## 2024-10-24 DIAGNOSIS — K635 Polyp of colon: Secondary | ICD-10-CM | POA: Diagnosis not present

## 2024-10-24 DIAGNOSIS — Z1211 Encounter for screening for malignant neoplasm of colon: Secondary | ICD-10-CM | POA: Diagnosis not present

## 2024-10-24 DIAGNOSIS — K648 Other hemorrhoids: Secondary | ICD-10-CM | POA: Diagnosis not present

## 2024-10-24 DIAGNOSIS — Z83719 Family history of colon polyps, unspecified: Secondary | ICD-10-CM | POA: Diagnosis not present

## 2024-11-20 ENCOUNTER — Ambulatory Visit: Admitting: Diagnostic Neuroimaging

## 2024-11-20 ENCOUNTER — Encounter: Payer: Self-pay | Admitting: Diagnostic Neuroimaging

## 2024-11-20 VITALS — BP 130/88 | HR 83 | Ht 69.0 in | Wt 272.2 lb

## 2024-11-20 DIAGNOSIS — R569 Unspecified convulsions: Secondary | ICD-10-CM

## 2024-11-20 DIAGNOSIS — Q283 Other malformations of cerebral vessels: Secondary | ICD-10-CM | POA: Diagnosis not present

## 2024-11-20 MED ORDER — LEVETIRACETAM 500 MG PO TABS: 500.0000 mg | ORAL_TABLET | Freq: Two times a day (BID) | ORAL | 4 refills | Status: AC

## 2024-11-20 NOTE — Patient Instructions (Signed)
  NEW ONSET SEIZURE (10/08/20; due to left temporal cavernous malformation, s/p resection 12/07/20) - doing well; continue levetiracetam  500mg  twice a day   SNORING / FATIGUE / BMI 40 - consider sleep study; patient will think about it and let us  know

## 2024-11-20 NOTE — Progress Notes (Signed)
 [facility]  PATIENT: Francis Flores DOB: 1981/09/01  REFERRING CLINICIAN: Koirala, Dibas, MD HISTORY FROM: patient  REASON FOR VISIT: follow up   HISTORICAL  CHIEF COMPLAINT:  Chief Complaint  Patient presents with   RM 7     Patient is here for seizure follow-up - no concerns     HISTORY OF PRESENT ILLNESS:   UPDATE (11/20/24, VRP): Since last visit, doing well. No seizures. Tolerating levetiracetam . Some fatigue since starting LEV. Family notes snoring, possible apnea spells. Fam hx sleep apnea in father and mother.   UPDATE (04/23/23, VRP): Since last visit, doing well. Symptoms are stable. Tolerating LEV. No seizures.  UPDATE (04/17/22, VRP): Since last visit, doing well. Symptoms are stable. LEV 500mg  twice a day stable.  UPDATE (04/18/21, VRP): Since last visit, doing well. Had left temporal lobe resection of mass (ended up as cavernous malformation). Doing well. No alleviating or aggravating factors. Tolerating levetiracetam .    PRIOR HPI: 43 year old male here for evaluation of seizure.  10/08/2020 at 2 in the morning patient was asleep, and woke up wife because he could grab her arm.  She woke up and then found that he was unresponsive, with generalized tonic-clonic seizure.  His body was shaking but arms were not moving.  Seizures lasted for about 1 to 2 minutes.  Patient bit his tongue.  Afterwards he went to a deep sleep and started to snore.  She called 911 and paramedics arrived.  Vital signs showed blood pressure 146/94, pulse 118, blood glucose 94, temperature 97.1, EKG strip showed sinus tachycardia.  Within about 1 hour he started to wake up and was able to answer questions.  Patient has had some headaches and muscle soreness since the seizure.  They followed up with walk-in urgent care clinic the next day and had lab testing which were unremarkable.  Patient referred here for further evaluation.  In the few days leading up to this event patient  had been having some stomach pain issues and decreased sleep.  No prior or recent accidents injuries or traumas.  No history of meningitis or encephalitis.  No family history of seizure.  No similar events in his past.  Patient does not drink caffeine or alcohol.   REVIEW OF SYSTEMS: Full 14 system review of systems performed and negative with exception of: As per HPI.  ALLERGIES: No Known Allergies  HOME MEDICATIONS: Outpatient Medications Prior to Visit  Medication Sig Dispense Refill   amLODipine (NORVASC) 5 MG tablet Take 1 tablet by mouth daily.     levETIRAcetam  (KEPPRA ) 500 MG tablet Take 1 tablet (500 mg total) by mouth 2 (two) times daily. 60 tablet 2   No facility-administered medications prior to visit.    PAST MEDICAL HISTORY: Past Medical History:  Diagnosis Date   Seizure-like activity (HCC)    Seizures (HCC)     PAST SURGICAL HISTORY: Past Surgical History:  Procedure Laterality Date   APPLICATION OF CRANIAL NAVIGATION N/A 12/07/2020   Procedure: APPLICATION OF CRANIAL NAVIGATION;  Surgeon: Cheryle Debby LABOR, MD;  Location: [facility] OR;  Service: Neurosurgery;  Laterality: N/A;   arm fracture     CRANIOTOMY Left 12/07/2020   Procedure: Left awake craniotomy for tumor resection;  Surgeon: Cheryle Debby LABOR, MD;  Location: [facility] OR;  Service: Neurosurgery;  Laterality: Left;    FAMILY HISTORY: No family history on file.  SOCIAL HISTORY: Social History   Socioeconomic History   Marital status: Married    Spouse name: Not  on file   Number of children: 2   Years of education: Not on file   Highest education level: Not on file  Occupational History    Comment: art gallery manager  Tobacco Use   Smoking status: Never   Smokeless tobacco: Never  Vaping Use   Vaping status: Never Used  Substance and Sexual Activity   Alcohol use: Never   Drug use: Not Currently   Sexual activity: Not on file  Other Topics Concern   Not on file  Social History Narrative   Lives with  wife, children   No caffeine    Social Drivers of Corporate Investment Banker Strain: Not on file  Food Insecurity: Not on file  Transportation Needs: Not on file  Physical Activity: Not on file  Stress: Not on file  Social Connections: Not on file  Intimate Partner Violence: Not on file     PHYSICAL EXAM  GENERAL EXAM/CONSTITUTIONAL: Vitals:  Vitals:   11/20/24 1401  BP: 130/88  Pulse: 83  SpO2: 97%  Weight: 272 lb 3.2 oz (123.5 kg)  Height: 5' 9 (1.753 m)    Body mass index is 40.2 kg/m. Wt Readings from Last 3 Encounters:  11/20/24 272 lb 3.2 oz (123.5 kg)  04/17/22 272 lb (123.4 kg)  04/18/21 266 lb (120.7 kg)   Patient is in no distress; well developed, nourished and groomed; neck is supple  CARDIOVASCULAR: Examination of carotid arteries is normal; no carotid bruits Regular rate and rhythm, no murmurs Examination of peripheral vascular system by observation and palpation is normal  EYES: Ophthalmoscopic exam of optic discs and posterior segments is normal; no papilledema or hemorrhages No results found.  MUSCULOSKELETAL: Gait, strength, tone, movements noted in Neurologic exam below  NEUROLOGIC: MENTAL STATUS:      No data to display         awake, alert, oriented to person, place and time recent and remote memory intact normal attention and concentration language fluent, comprehension intact, naming intact fund of knowledge appropriate  CRANIAL NERVE:  2nd - no papilledema on fundoscopic exam 2nd, 3rd, 4th, 6th - pupils equal and reactive to light, visual fields full to confrontation, extraocular muscles intact, no nystagmus 5th - facial sensation symmetric 7th - facial strength symmetric 8th - hearing intact 9th - palate elevates symmetrically, uvula midline 11th - shoulder shrug symmetric 12th - tongue protrusion midline  MOTOR:  normal bulk and tone, full strength in the BUE, BLE  SENSORY:  normal and symmetric to light touch,  temperature, vibration  COORDINATION:  finger-nose-finger, fine finger movements normal  REFLEXES:  deep tendon reflexes trace and symmetric  GAIT/STATION:  narrow based gait    DIAGNOSTIC DATA (LABS, IMAGING, TESTING) - I reviewed patient records, labs, notes, testing and imaging myself where available.  Lab Results  Component Value Date   WBC 24.8 (H) 12/07/2020   HGB 14.9 12/07/2020   HCT 46.6 12/07/2020   MCV 93.6 12/07/2020   PLT 225 12/07/2020      Component Value Date/Time   NA 131 (L) 12/07/2020 1112   K 4.8 12/07/2020 1112   CL 97 (L) 12/01/2020 0833   CO2 25 12/01/2020 0833   GLUCOSE 99 12/01/2020 0833   BUN 26 (H) 12/01/2020 0833   CREATININE 0.82 12/07/2020 1448   CALCIUM 9.0 12/01/2020 0833   GFRNONAA >60 12/07/2020 1448   No results found for: CHOL, HDL, LDLCALC, LDLDIRECT, TRIG, CHOLHDL No results found for: YHAJ8R No results found for: VITAMINB12 No  results found for: TSH   10/08/20 CBC, CMP normal except slightly elevated LFTs  10/29/20 MRI of the brain with and without contrast shows the following: 1.  There is a hemorrhage in the lateral left temporal lobe with intracellular and extracellular methemoglobin consistent with an event occurring between 3 and 28 days ago, most likely in the middle of the range.  There is mild mass-effect associated with this focus.  Although there is no enhancement noted, a pathologic etiology cannot be ruled out.  Consider repeat imaging in the future to further evaluate. 2.  The rest of the brain was normal.  12/07/20 MRI brain  - Expected postoperative changes post gross total resection of left temporal hematoma/mass.    ASSESSMENT AND PLAN  43 y.o. year old male here with:  Dx:  1. New onset seizure (HCC)   2. Cavernous malformation     PLAN:  NEW ONSET SEIZURE (10/08/20; due to left temporal cavernous malformation, s/p resection 12/07/20) - doing well; continue levetiracetam  500mg   twice a day   SNORING / FATIGUE / BMI 40 - consider sleep study; patient will think about it and let us  know  Meds ordered this encounter  Medications   levETIRAcetam  (KEPPRA ) 500 MG tablet    Sig: Take 1 tablet (500 mg total) by mouth 2 (two) times daily.    Dispense:  180 tablet    Refill:  4   Return in about 1 year (around 11/20/2025) for MyChart visit (15 min), with NP.    EDUARD FABIENE HANLON, MD 11/20/2024, 2:21 PM Certified in Neurology, Neurophysiology and Neuroimaging  [facility] [address], [address] [place], [place] [address] [phone]

## 2025-11-24 ENCOUNTER — Telehealth: Admitting: Diagnostic Neuroimaging
